# Patient Record
Sex: Male | Born: 1975
Health system: Southern US, Community
[De-identification: ages and names within clinical notes are randomized; demographics above are authoritative.]

## PROBLEM LIST (undated history)

## (undated) DIAGNOSIS — R7989 Other specified abnormal findings of blood chemistry: Secondary | ICD-10-CM

## (undated) DIAGNOSIS — I712 Thoracic aortic aneurysm, without rupture, unspecified: Secondary | ICD-10-CM

## (undated) DIAGNOSIS — F419 Anxiety disorder, unspecified: Secondary | ICD-10-CM

## (undated) DIAGNOSIS — E669 Obesity, unspecified: Secondary | ICD-10-CM

## (undated) DIAGNOSIS — J309 Allergic rhinitis, unspecified: Secondary | ICD-10-CM

## (undated) DIAGNOSIS — L509 Urticaria, unspecified: Secondary | ICD-10-CM

## (undated) DIAGNOSIS — I7121 Aneurysm of the ascending aorta, without rupture: Secondary | ICD-10-CM

## (undated) DIAGNOSIS — E785 Hyperlipidemia, unspecified: Secondary | ICD-10-CM

## (undated) HISTORY — DX: Thoracic aortic aneurysm, without rupture: I71.2

## (undated) HISTORY — PX: ROTATOR CUFF REPAIR: SHX139

## (undated) HISTORY — DX: Hyperlipidemia, unspecified: E78.5

## (undated) HISTORY — DX: Allergic rhinitis, unspecified: J30.9

## (undated) HISTORY — DX: Urticaria, unspecified: L50.9

## (undated) HISTORY — DX: Thoracic aortic aneurysm, without rupture, unspecified: I71.20

## (undated) HISTORY — DX: Anxiety disorder, unspecified: F41.9

## (undated) HISTORY — PX: WRIST SURGERY: SHX841

## (undated) HISTORY — DX: Obesity, unspecified: E66.9

---

## 1998-12-07 ENCOUNTER — Encounter: Admission: RE | Admit: 1998-12-07 | Discharge: 1998-12-07 | Payer: Self-pay | Admitting: *Deleted

## 2000-07-27 ENCOUNTER — Encounter: Payer: Self-pay | Admitting: Emergency Medicine

## 2000-07-27 ENCOUNTER — Emergency Department (HOSPITAL_COMMUNITY): Admission: EM | Admit: 2000-07-27 | Discharge: 2000-07-27 | Payer: Self-pay | Admitting: Emergency Medicine

## 2001-02-17 ENCOUNTER — Encounter: Payer: Self-pay | Admitting: Occupational Medicine

## 2001-02-17 ENCOUNTER — Encounter: Admission: RE | Admit: 2001-02-17 | Discharge: 2001-02-17 | Payer: Self-pay | Admitting: Occupational Medicine

## 2004-06-09 ENCOUNTER — Emergency Department (HOSPITAL_COMMUNITY): Admission: EM | Admit: 2004-06-09 | Discharge: 2004-06-09 | Payer: Self-pay | Admitting: Emergency Medicine

## 2006-07-08 ENCOUNTER — Emergency Department (HOSPITAL_COMMUNITY): Admission: EM | Admit: 2006-07-08 | Discharge: 2006-07-09 | Payer: Self-pay | Admitting: Emergency Medicine

## 2006-10-14 ENCOUNTER — Ambulatory Visit: Payer: Self-pay | Admitting: Internal Medicine

## 2018-01-22 DIAGNOSIS — M25511 Pain in right shoulder: Secondary | ICD-10-CM | POA: Diagnosis not present

## 2018-01-22 DIAGNOSIS — M7541 Impingement syndrome of right shoulder: Secondary | ICD-10-CM | POA: Diagnosis not present

## 2018-02-23 DIAGNOSIS — M7541 Impingement syndrome of right shoulder: Secondary | ICD-10-CM | POA: Diagnosis not present

## 2018-03-03 DIAGNOSIS — M25511 Pain in right shoulder: Secondary | ICD-10-CM | POA: Diagnosis not present

## 2018-03-11 DIAGNOSIS — M7541 Impingement syndrome of right shoulder: Secondary | ICD-10-CM | POA: Diagnosis not present

## 2018-03-23 ENCOUNTER — Other Ambulatory Visit: Payer: Self-pay | Admitting: Internal Medicine

## 2018-03-23 DIAGNOSIS — I712 Thoracic aortic aneurysm, without rupture, unspecified: Secondary | ICD-10-CM

## 2018-03-24 DIAGNOSIS — I77810 Thoracic aortic ectasia: Secondary | ICD-10-CM | POA: Diagnosis not present

## 2018-03-25 ENCOUNTER — Other Ambulatory Visit: Payer: Self-pay

## 2018-03-25 ENCOUNTER — Encounter (HOSPITAL_COMMUNITY): Payer: Self-pay | Admitting: *Deleted

## 2018-03-25 NOTE — Progress Notes (Signed)
Spoke with pt for pre-op call. Pt states he has an Ascending Aortic Aneurysm which was an incidental finding 2 years ago. He states he is followed by Dr. Corrinne Eagle, was told to have aneurysm checked every 2 years. He states Dr. Corrinne Eagle is on medical leave, but had CTA yesterday (in Care Everywhere) and states there was no change. Pt denies any other cardiac history, HTN or Diabetes.

## 2018-03-25 NOTE — Progress Notes (Signed)
Anesthesia Chart Review:  Pt is a same day work up   Case:  [id number] Date/Time:  03/26/18 1430   Procedure:  RIGHT SHOULDER ARTHROSCOPY WITH SUBACROMIAL DECOMPRESSION AND DISTAL CLAVICLE EXCISION (Right Shoulder)   Anesthesia type:  General   Pre-op diagnosis:  Right shoulder chronic impingement, acromioclavicular osteoarthritis   Location:  [facility]OM 06 / [facility]   Surgeon:  Francena Hanly, MD      DISCUSSION:  - Pt is a 42 year old male with hx TAA (4.2 cm, stable by 03/24/18 CT).  - Former smoker. Current smokeless tobacco user   PROVIDERS: Patient, No Pcp Per Cardiologist is Therisa Doyne, MD ("Dr. Sigurd Sos") (notes in care everywhere)   LABS: Will be obtained day of surgery   IMAGES:  CT angio 03/24/18 (care everywhere):  - Ectatic ascending thoracic aorta measuring 4.1 x 3.8 cm in the corrected plane of the vessel.  - In the direct axial plane the maximal measurement of the ascending aorta is 4.2 cm suggesting there has been no significant change from the previously reported measurement of "4.3 cm" on 02/28/2016.   EKG: N/A   CV:  Nuclear stress test 02/29/16 (care everywhere):  1. No reversible ischemia or infarction. 2. Normal left ventricular wall motion. 3. Left ventricular ejection fraction 60% 4. Low-risk stress test findings  Echo 02/29/16:  - Normal LV size and systolic function with no appreciable segmental abnormality. Ejection fraction is visually estimated at 55-60% - Normal Diastolic function - Trace mitral regurgitation - Mild Left atrial enlargement.   Past Medical History:  Diagnosis Date  . Ascending aortic aneurysm (HCC)    Is followed by Dr. Corrinne Eagle at [facility]    Past Surgical History:  Procedure Laterality Date  . WRIST SURGERY Right    to remove ganglion cyst and scar tissue    MEDICATIONS: No current facility-administered medications for this encounter.    Marland Kitchen ALPRAZolam (XANAX) 0.5 MG tablet  . ibuprofen (ADVIL,MOTRIN) 200  MG tablet  . predniSONE (DELTASONE) 10 MG tablet    If labs acceptable day of surgery, I anticipate pt can proceed with surgery as scheduled.  Rica Mast, FNP-BC [facility]/Anesthesiology Phone: [phone] 03/25/2018 1:01 PM

## 2018-03-26 ENCOUNTER — Ambulatory Visit (HOSPITAL_COMMUNITY)
Admission: RE | Admit: 2018-03-26 | Discharge: 2018-03-26 | Disposition: A | Discharge status: Home / Self Care | Payer: Commercial Managed Care - PPO | Source: Ambulatory Visit | Attending: Orthopedic Surgery | Admitting: Orthopedic Surgery

## 2018-03-26 ENCOUNTER — Ambulatory Visit (HOSPITAL_COMMUNITY): Payer: Commercial Managed Care - PPO | Admitting: Emergency Medicine

## 2018-03-26 ENCOUNTER — Encounter (HOSPITAL_COMMUNITY): Payer: Self-pay | Admitting: *Deleted

## 2018-03-26 ENCOUNTER — Encounter (HOSPITAL_COMMUNITY)
Admission: RE | Disposition: A | Discharge status: Home / Self Care | Payer: Self-pay | Source: Ambulatory Visit | Attending: Orthopedic Surgery

## 2018-03-26 DIAGNOSIS — F17290 Nicotine dependence, other tobacco product, uncomplicated: Secondary | ICD-10-CM | POA: Diagnosis not present

## 2018-03-26 DIAGNOSIS — Z79899 Other long term (current) drug therapy: Secondary | ICD-10-CM | POA: Insufficient documentation

## 2018-03-26 DIAGNOSIS — I712 Thoracic aortic aneurysm, without rupture: Secondary | ICD-10-CM | POA: Diagnosis not present

## 2018-03-26 DIAGNOSIS — Z7952 Long term (current) use of systemic steroids: Secondary | ICD-10-CM | POA: Diagnosis not present

## 2018-03-26 DIAGNOSIS — S46191A Other injury of muscle, fascia and tendon of long head of biceps, right arm, initial encounter: Secondary | ICD-10-CM | POA: Diagnosis not present

## 2018-03-26 DIAGNOSIS — M24111 Other articular cartilage disorders, right shoulder: Secondary | ICD-10-CM | POA: Insufficient documentation

## 2018-03-26 DIAGNOSIS — M7541 Impingement syndrome of right shoulder: Secondary | ICD-10-CM | POA: Insufficient documentation

## 2018-03-26 DIAGNOSIS — Z791 Long term (current) use of non-steroidal anti-inflammatories (NSAID): Secondary | ICD-10-CM | POA: Diagnosis not present

## 2018-03-26 DIAGNOSIS — M75111 Incomplete rotator cuff tear or rupture of right shoulder, not specified as traumatic: Secondary | ICD-10-CM | POA: Insufficient documentation

## 2018-03-26 DIAGNOSIS — M19011 Primary osteoarthritis, right shoulder: Secondary | ICD-10-CM | POA: Diagnosis not present

## 2018-03-26 DIAGNOSIS — S43431A Superior glenoid labrum lesion of right shoulder, initial encounter: Secondary | ICD-10-CM | POA: Diagnosis not present

## 2018-03-26 DIAGNOSIS — F419 Anxiety disorder, unspecified: Secondary | ICD-10-CM | POA: Diagnosis not present

## 2018-03-26 DIAGNOSIS — I739 Peripheral vascular disease, unspecified: Secondary | ICD-10-CM | POA: Insufficient documentation

## 2018-03-26 DIAGNOSIS — G8918 Other acute postprocedural pain: Secondary | ICD-10-CM | POA: Diagnosis not present

## 2018-03-26 DIAGNOSIS — M75101 Unspecified rotator cuff tear or rupture of right shoulder, not specified as traumatic: Secondary | ICD-10-CM | POA: Diagnosis not present

## 2018-03-26 HISTORY — DX: Aneurysm of the ascending aorta, without rupture: I71.21

## 2018-03-26 HISTORY — DX: Thoracic aortic aneurysm, without rupture: I71.2

## 2018-03-26 LAB — CBC
HEMATOCRIT: 44.5 % (ref 39.0–52.0)
Hemoglobin: 15.4 g/dL (ref 13.0–17.0)
MCH: 31.4 pg (ref 26.0–34.0)
MCHC: 34.6 g/dL (ref 30.0–36.0)
MCV: 90.8 fL (ref 78.0–100.0)
PLATELETS: 226 10*3/uL (ref 150–400)
RBC: 4.9 MIL/uL (ref 4.22–5.81)
RDW: 12.8 % (ref 11.5–15.5)
WBC: 5.2 10*3/uL (ref 4.0–10.5)

## 2018-03-26 SURGERY — SHOULDER ARTHROSCOPY WITH SUBACROMIAL DECOMPRESSION AND DISTAL CLAVICLE EXCISION
Anesthesia: General | Site: Shoulder | Laterality: Right

## 2018-03-26 MED ORDER — KETOROLAC TROMETHAMINE 30 MG/ML IJ SOLN: 30.0000 mg | Freq: Once | INTRAMUSCULAR | Status: DC | PRN

## 2018-03-26 MED ORDER — PROPOFOL 10 MG/ML IV BOLUS
INTRAVENOUS | Status: AC
  Filled 2018-03-26: qty 20

## 2018-03-26 MED ORDER — FENTANYL CITRATE (PF) 100 MCG/2ML IJ SOLN
100.0000 ug | Freq: Once | INTRAMUSCULAR | Status: AC
  Administered 2018-03-26: 100 ug via INTRAVENOUS

## 2018-03-26 MED ORDER — OXYCODONE HCL 5 MG PO TABS: 5.0000 mg | ORAL_TABLET | Freq: Once | ORAL | Status: DC | PRN

## 2018-03-26 MED ORDER — SUGAMMADEX SODIUM 200 MG/2ML IV SOLN
INTRAVENOUS | Status: DC | PRN
  Administered 2018-03-26: 200 mg via INTRAVENOUS

## 2018-03-26 MED ORDER — BUPIVACAINE-EPINEPHRINE (PF) 0.5% -1:200000 IJ SOLN
INTRAMUSCULAR | Status: DC | PRN
  Administered 2018-03-26 (×6): 3 mL via PERINEURAL

## 2018-03-26 MED ORDER — MIDAZOLAM HCL 2 MG/2ML IJ SOLN
INTRAMUSCULAR | Status: AC
  Administered 2018-03-26: 2 mg via INTRAVENOUS
  Filled 2018-03-26: qty 2

## 2018-03-26 MED ORDER — OXYCODONE-ACETAMINOPHEN 5-325 MG PO TABS: 1.0000 | ORAL_TABLET | ORAL | 0 refills | Status: DC | PRN

## 2018-03-26 MED ORDER — BUPIVACAINE LIPOSOME 1.3 % IJ SUSP
INTRAMUSCULAR | Status: DC | PRN
  Administered 2018-03-26 (×5): 2 mL via PERINEURAL

## 2018-03-26 MED ORDER — OXYCODONE HCL 5 MG/5ML PO SOLN: 5.0000 mg | Freq: Once | ORAL | Status: DC | PRN

## 2018-03-26 MED ORDER — ONDANSETRON HCL 4 MG/2ML IJ SOLN: 4.0000 mg | Freq: Once | INTRAMUSCULAR | Status: DC | PRN

## 2018-03-26 MED ORDER — MIDAZOLAM HCL 5 MG/5ML IJ SOLN
INTRAMUSCULAR | Status: DC | PRN
  Administered 2018-03-26: 2 mg via INTRAVENOUS

## 2018-03-26 MED ORDER — ONDANSETRON HCL 4 MG/2ML IJ SOLN
INTRAMUSCULAR | Status: DC | PRN
  Administered 2018-03-26: 4 mg via INTRAVENOUS

## 2018-03-26 MED ORDER — MIDAZOLAM HCL 2 MG/2ML IJ SOLN
INTRAMUSCULAR | Status: AC
  Filled 2018-03-26: qty 2

## 2018-03-26 MED ORDER — CYCLOBENZAPRINE HCL 10 MG PO TABS: 10.0000 mg | ORAL_TABLET | Freq: Three times a day (TID) | ORAL | 0 refills | Status: AC | PRN

## 2018-03-26 MED ORDER — FENTANYL CITRATE (PF) 250 MCG/5ML IJ SOLN
INTRAMUSCULAR | Status: AC
  Filled 2018-03-26: qty 5

## 2018-03-26 MED ORDER — LIDOCAINE 2% (20 MG/ML) 5 ML SYRINGE
INTRAMUSCULAR | Status: DC | PRN
  Administered 2018-03-26: 100 mg via INTRAVENOUS

## 2018-03-26 MED ORDER — CEFAZOLIN SODIUM-DEXTROSE 2-4 GM/100ML-% IV SOLN
INTRAVENOUS | Status: AC
  Filled 2018-03-26: qty 100

## 2018-03-26 MED ORDER — LACTATED RINGERS IV SOLN
INTRAVENOUS | Status: DC | PRN
  Administered 2018-03-26: 15:00:00 via INTRAVENOUS

## 2018-03-26 MED ORDER — ACETAMINOPHEN 160 MG/5ML PO SOLN: 325.0000 mg | ORAL | Status: DC | PRN

## 2018-03-26 MED ORDER — FENTANYL CITRATE (PF) 100 MCG/2ML IJ SOLN
INTRAMUSCULAR | Status: DC | PRN
  Administered 2018-03-26 (×2): 100 ug via INTRAVENOUS
  Administered 2018-03-26: 50 ug via INTRAVENOUS

## 2018-03-26 MED ORDER — NAPROXEN 500 MG PO TABS: 500.0000 mg | ORAL_TABLET | Freq: Two times a day (BID) | ORAL | 1 refills | Status: DC

## 2018-03-26 MED ORDER — LIDOCAINE 2% (20 MG/ML) 5 ML SYRINGE
INTRAMUSCULAR | Status: AC
  Filled 2018-03-26: qty 5

## 2018-03-26 MED ORDER — ROCURONIUM BROMIDE 50 MG/5ML IV SOLN
INTRAVENOUS | Status: AC
  Filled 2018-03-26: qty 1

## 2018-03-26 MED ORDER — CEFAZOLIN SODIUM-DEXTROSE 2-4 GM/100ML-% IV SOLN: 2.0000 g | Freq: Once | INTRAVENOUS | Status: DC

## 2018-03-26 MED ORDER — FENTANYL CITRATE (PF) 100 MCG/2ML IJ SOLN
INTRAMUSCULAR | Status: AC
  Administered 2018-03-26: 100 ug via INTRAVENOUS
  Filled 2018-03-26: qty 2

## 2018-03-26 MED ORDER — DEXTROSE 5 % IV SOLN
3.0000 g | Freq: Once | INTRAVENOUS | Status: AC
  Administered 2018-03-26: 3 g via INTRAVENOUS
  Filled 2018-03-26: qty 3

## 2018-03-26 MED ORDER — ROCURONIUM BROMIDE 10 MG/ML (PF) SYRINGE
PREFILLED_SYRINGE | INTRAVENOUS | Status: DC | PRN
  Administered 2018-03-26: 50 mg via INTRAVENOUS

## 2018-03-26 MED ORDER — MEPERIDINE HCL 50 MG/ML IJ SOLN: 6.2500 mg | INTRAMUSCULAR | Status: DC | PRN

## 2018-03-26 MED ORDER — FENTANYL CITRATE (PF) 100 MCG/2ML IJ SOLN: 25.0000 ug | INTRAMUSCULAR | Status: DC | PRN

## 2018-03-26 MED ORDER — SODIUM CHLORIDE 0.9 % IR SOLN
Status: DC | PRN
  Administered 2018-03-26: 3000 mL

## 2018-03-26 MED ORDER — LACTATED RINGERS IV SOLN
INTRAVENOUS | Status: DC
  Administered 2018-03-26: 13:00:00 via INTRAVENOUS

## 2018-03-26 MED ORDER — MIDAZOLAM HCL 2 MG/2ML IJ SOLN
2.0000 mg | Freq: Once | INTRAMUSCULAR | Status: AC
  Administered 2018-03-26: 2 mg via INTRAVENOUS

## 2018-03-26 MED ORDER — ACETAMINOPHEN 325 MG PO TABS: 325.0000 mg | ORAL_TABLET | ORAL | Status: DC | PRN

## 2018-03-26 MED ORDER — PROPOFOL 10 MG/ML IV BOLUS
INTRAVENOUS | Status: DC | PRN
  Administered 2018-03-26: 200 mg via INTRAVENOUS

## 2018-03-26 MED ORDER — ONDANSETRON HCL 4 MG PO TABS: 4.0000 mg | ORAL_TABLET | Freq: Three times a day (TID) | ORAL | 0 refills | Status: DC | PRN

## 2018-03-26 SURGICAL SUPPLY — 57 items
BLADE CUTTER GATOR 3.5 (BLADE) ×1 IMPLANT
BLADE EXCALIBUR 4.0X13 (MISCELLANEOUS) ×2 IMPLANT
BLADE GREAT WHITE 4.2 (BLADE) ×1 IMPLANT
BLADE SURG 11 STRL SS (BLADE) ×1 IMPLANT
BOOTCOVER CLEANROOM LRG (PROTECTIVE WEAR) ×2 IMPLANT
BUR OVAL 4.0 (BURR) ×1 IMPLANT
BURR OVAL 8 FLU 4.0X13 (MISCELLANEOUS) ×1 IMPLANT
CANISTER SUCT LVC 12 LTR MEDI- (MISCELLANEOUS) ×2 IMPLANT
CANNULA ACUFLEX KIT 5X76 (CANNULA) ×2 IMPLANT
CANNULA DRILOCK 5.0X75 (CANNULA) IMPLANT
CONNECTOR 5 IN 1 STRAIGHT STRL (MISCELLANEOUS) ×2 IMPLANT
DRAPE INCISE 23X17 IOBAN STRL (DRAPES) ×1
DRAPE INCISE 23X17 STRL (DRAPES) IMPLANT
DRAPE INCISE IOBAN 23X17 STRL (DRAPES) ×1 IMPLANT
DRAPE INCISE IOBAN 66X45 STRL (DRAPES) ×2 IMPLANT
DRAPE ORTHO SPLIT 77X108 STRL (DRAPES) ×4
DRAPE STERI 35X30 U-POUCH (DRAPES) ×1 IMPLANT
DRAPE SURG 17X11 SM STRL (DRAPES) ×2 IMPLANT
DRAPE SURG ORHT 6 SPLT 77X108 (DRAPES) ×2 IMPLANT
DRAPE U-SHAPE 47X51 STRL (DRAPES) IMPLANT
DRSG PAD ABDOMINAL 8X10 ST (GAUZE/BANDAGES/DRESSINGS) ×4 IMPLANT
DURAPREP 26ML APPLICATOR (WOUND CARE) ×2 IMPLANT
GAUZE SPONGE 4X4 12PLY STRL (GAUZE/BANDAGES/DRESSINGS) ×2 IMPLANT
GLOVE BIO SURGEON STRL SZ7.5 (GLOVE) ×2 IMPLANT
GLOVE BIO SURGEON STRL SZ8 (GLOVE) ×2 IMPLANT
GLOVE EUDERMIC 7 POWDERFREE (GLOVE) ×2 IMPLANT
GLOVE SS BIOGEL STRL SZ 7.5 (GLOVE) ×1 IMPLANT
GLOVE SUPERSENSE BIOGEL SZ 7.5 (GLOVE) ×1
GOWN STRL REUS W/ TWL LRG LVL3 (GOWN DISPOSABLE) ×1 IMPLANT
GOWN STRL REUS W/ TWL XL LVL3 (GOWN DISPOSABLE) ×2 IMPLANT
GOWN STRL REUS W/TWL LRG LVL3 (GOWN DISPOSABLE) ×2
GOWN STRL REUS W/TWL XL LVL3 (GOWN DISPOSABLE) ×4
KIT BASIN OR (CUSTOM PROCEDURE TRAY) ×2 IMPLANT
KIT SHOULDER TRACTION (DRAPES) ×2 IMPLANT
KIT TURNOVER KIT B (KITS) ×2 IMPLANT
MANIFOLD NEPTUNE II (INSTRUMENTS) ×2 IMPLANT
NDL SPNL 18GX3.5 QUINCKE PK (NEEDLE) ×1 IMPLANT
NEEDLE SPNL 18GX3.5 QUINCKE PK (NEEDLE) ×2 IMPLANT
NS IRRIG 1000ML POUR BTL (IV SOLUTION) ×1 IMPLANT
PACK SHOULDER (CUSTOM PROCEDURE TRAY) ×2 IMPLANT
PAD ABD 8X10 STRL (GAUZE/BANDAGES/DRESSINGS) ×1 IMPLANT
PAD ARMBOARD 7.5X6 YLW CONV (MISCELLANEOUS) ×4 IMPLANT
SET ARTHROSCOPY TUBING (MISCELLANEOUS)
SET ARTHROSCOPY TUBING LN (MISCELLANEOUS) ×1 IMPLANT
SLING ARM FOAM STRAP LRG (SOFTGOODS) IMPLANT
SLING ARM FOAM STRAP MED (SOFTGOODS) ×2 IMPLANT
SPONGE LAP 4X18 X RAY DECT (DISPOSABLE) IMPLANT
STRIP CLOSURE SKIN 1/2X4 (GAUZE/BANDAGES/DRESSINGS) ×2 IMPLANT
SUT MNCRL AB 3-0 PS2 18 (SUTURE) ×2 IMPLANT
SUT PDS AB 0 CT 36 (SUTURE) IMPLANT
SYR 20CC LL (SYRINGE) IMPLANT
TAPE PAPER 3X10 WHT MICROPORE (GAUZE/BANDAGES/DRESSINGS) ×2 IMPLANT
TOWEL OR 17X24 6PK STRL BLUE (TOWEL DISPOSABLE) ×1 IMPLANT
TOWEL OR 17X26 10 PK STRL BLUE (TOWEL DISPOSABLE) ×2 IMPLANT
TUBING ARTHROSCOPY IRRIG 16FT (MISCELLANEOUS) ×1 IMPLANT
WAND SUCTION MAX 4MM 90S (SURGICAL WAND) ×1 IMPLANT
WATER STERILE IRR 1000ML POUR (IV SOLUTION) ×2 IMPLANT

## 2018-03-26 NOTE — Anesthesia Preprocedure Evaluation (Signed)
Anesthesia Evaluation  Patient identified by MRN, date of birth, ID band Patient awake    Reviewed: Allergy & Precautions, NPO status , Patient's Chart, lab work & pertinent test results  Airway Mallampati: II       Dental no notable dental hx. (+) Teeth Intact   Pulmonary former smoker,    Pulmonary exam normal breath sounds clear to auscultation       Cardiovascular + Peripheral Vascular Disease  Normal cardiovascular exam Rhythm:Regular Rate:Normal     Neuro/Psych PSYCHIATRIC DISORDERS Anxiety    GI/Hepatic negative GI ROS, Neg liver ROS,   Endo/Other  negative endocrine ROS  Renal/GU negative Renal ROS  negative genitourinary   Musculoskeletal negative musculoskeletal ROS (+)   Abdominal (+) + obese,   Peds  Hematology negative hematology ROS (+)   Anesthesia Other Findings  Nurse Practitioner Anesthesiology Progress Notes Signed Date of Service:  03/25/2018 12:51 PM    Signed     Anesthesia Chart Review:  Pt is a same day work up    Case:  [id number] Date/Time:  03/26/18 1430  Procedure:  RIGHT SHOULDER ARTHROSCOPY WITH SUBACROMIAL DECOMPRESSION AND DISTAL CLAVICLE EXCISION (Right Shoulder)  Anesthesia type:  General  Pre-op diagnosis:  Right shoulder chronic impingement, acromioclavicular osteoarthritis  Location:  [facility]  Surgeon:  Francena Hanly, MD    DISCUSSION:  - Pt is a 42 year old male with hx TAA (4.2 cm, stable by 03/24/18 CT).  - Former smoker. Current smokeless tobacco user   PROVIDERS: Patient, No Pcp Per Cardiologist is Therisa Doyne, MD ("Dr. Sigurd Sos") (notes in care everywhere)   LABS: Will be obtained day of surgery   IMAGES:  CT angio 03/24/18 (care everywhere):  - Ectatic ascending thoracic aorta measuring 4.1 x 3.8 cm in the corrected plane of the vessel.  - In the direct axial plane the maximal measurement of the ascending aorta is  4.2 cm suggesting there has been no significant change from the previously reported measurement of "4.3 cm" on 02/28/2016.   EKG: N/A   CV:  Nuclear stress test 02/29/16 (care everywhere):  1. No reversible ischemia or infarction. 2. Normal left ventricular wall motion. 3. Left ventricular ejection fraction 60% 4. Low-risk stress test findings  Echo 02/29/16:  - Normal LV size and systolic function with no appreciable segmental abnormality. Ejection fraction is visually estimated at 55-60% - Normal Diastolic function - Trace mitral regurgitation - Mild Left atrial enlargement.    Past Medical History: Diagnosis Date . Ascending aortic aneurysm (HCC)   Is followed by Dr. Corrinne Eagle at [facility]    Past Surgical History: Procedure Laterality Date . WRIST SURGERY Right   to remove ganglion cyst and scar tissue   MEDICATIONS:  No current facility-administered medications for this encounter.    Marland Kitchen ALPRAZolam (XANAX) 0.5 MG tablet . ibuprofen (ADVIL,MOTRIN) 200 MG tablet . predniSONE (DELTASONE) 10 MG tablet   If labs acceptable day of surgery, I anticipate pt can proceed with surgery as scheduled.  Rica Mast, FNP-BC [facility] Stay Surgical Center/Anesthesiology Phone: [phone] 03/25/2018 1:01 PM                Winifred Olive, RN            Reproductive/Obstetrics                             Anesthesia Physical Anesthesia Plan  ASA: II  Anesthesia Plan: General   Post-op Pain Management:  Regional for Post-op pain   Induction:   PONV Risk Score and Plan: 2 and Ondansetron, Dexamethasone and Midazolam  Airway Management Planned: Oral ETT  Additional Equipment:   Intra-op Plan:   Post-operative Plan: Extubation in OR  Informed Consent: I have reviewed the patients History and Physical, chart, labs and discussed the procedure including the risks, benefits and alternatives  for the proposed anesthesia with the patient or authorized representative who has indicated his/her understanding and acceptance.   Dental advisory given  Plan Discussed with: CRNA and Surgeon  Anesthesia Plan Comments:         Anesthesia Quick Evaluation

## 2018-03-26 NOTE — Anesthesia Postprocedure Evaluation (Signed)
Anesthesia Post Note  Patient: Phillip Tran  Procedure(s) Performed: RIGHT SHOULDER ARTHROSCOPY WITH SUBACROMIAL DECOMPRESSION AND DISTAL CLAVICLE EXCISION (Right Shoulder)     Patient location during evaluation: PACU Anesthesia Type: General Level of consciousness: awake and alert Pain management: pain level controlled Vital Signs Assessment: post-procedure vital signs reviewed and stable Respiratory status: spontaneous breathing, nonlabored ventilation, respiratory function stable and patient connected to nasal cannula oxygen Cardiovascular status: blood pressure returned to baseline and stable Postop Assessment: no apparent nausea or vomiting Anesthetic complications: no    Last Vitals:  Vitals:   03/26/18 1640 03/26/18 1653  BP: (!) 131/93 130/89  Pulse: 79 76  Resp: 16 15  Temp:    SpO2: 96% 97%    Last Pain:  Vitals:   03/26/18 1625  TempSrc:   PainSc: 0-No pain                 Juliani Laduke

## 2018-03-26 NOTE — H&P (Signed)
Phillip Tran    Chief Complaint: Right shoulder chronic impingement, acromioclavicular osteoarthritis HPI: The patient is a 42 y.o. male with chronic right shoulder pain refractory to conservative management.  Past Medical History:  Diagnosis Date  . Ascending aortic aneurysm (HCC)    Is followed by Dr. Sharol Roussel    Past Surgical History:  Procedure Laterality Date  . WRIST SURGERY Right    to remove ganglion cyst and scar tissue    Family History  Problem Relation Age of Onset  . Scoliosis Father   . Arthritis/Rheumatoid Father     Social History:  reports that he has quit smoking. His smokeless tobacco use includes snuff. He reports that he drinks about 12.6 oz of alcohol per week. He reports that he does not use drugs.   Medications Prior to Admission  Medication Sig Dispense Refill  . ALPRAZolam (XANAX) 0.5 MG tablet Take 0.5 mg by mouth daily as needed for anxiety.  5  . ibuprofen (ADVIL,MOTRIN) 200 MG tablet Take 800 mg by mouth daily.    . predniSONE (DELTASONE) 10 MG tablet Take 10 mg by mouth daily as needed (for hives).   1     Physical Exam: right shoulder with painful and restricted motion as noted at recent office visits  Vitals  Temp:  [98.9 F (37.2 C)] 98.9 F (37.2 C) (05/09 1228) Pulse Rate:  [84] 84 (05/09 1228) Resp:  [20] 20 (05/09 1228) BP: (153)/(92) 153/92 (05/09 1228) SpO2:  [98 %] 98 % (05/09 1228) Weight:  [123.8 kg (273 lb)] 123.8 kg (273 lb) (05/09 1300)  Assessment/Plan  Impression: Right shoulder chronic impingement, acromioclavicular osteoarthritis  Plan of Action: Procedure(s): RIGHT SHOULDER ARTHROSCOPY WITH SUBACROMIAL DECOMPRESSION AND DISTAL CLAVICLE EXCISION  [name] [name] 03/26/2018, 2:26 PM Contact # [phone]

## 2018-03-26 NOTE — Anesthesia Procedure Notes (Signed)
Anesthesia Regional Block: Interscalene brachial plexus block   Pre-Anesthetic Checklist: ,, timeout performed, Correct Patient, Correct Site, Correct Laterality, Correct Procedure, Correct Position, site marked, Risks and benefits discussed,  Surgical consent,  Pre-op evaluation,  At surgeon's request and post-op pain management  Laterality: Right and Upper  Prep: chloraprep       Needles:  Injection technique: Single-shot  Needle Type: Echogenic Stimulator Needle     Needle Length: 10cm  Needle Gauge: 21   Needle insertion depth: 2 cm   Additional Needles:   Procedures:,,,, ultrasound used (permanent image in chart),,,,  Narrative:  Start time: 03/26/2018 2:15 PM End time: 03/26/2018 2:26 PM Injection made incrementally with aspirations every 5 mL.  Performed by: Personally  Anesthesiologist: Leilani Able, MD

## 2018-03-26 NOTE — Transfer of Care (Signed)
Immediate Anesthesia Transfer of Care Note  Patient: Phillip Tran  Procedure(s) Performed: RIGHT SHOULDER ARTHROSCOPY WITH SUBACROMIAL DECOMPRESSION AND DISTAL CLAVICLE EXCISION (Right Shoulder)  Patient Location: PACU  Anesthesia Type:General  Level of Consciousness: awake  Airway & Oxygen Therapy: Patient Spontanous Breathing and Patient connected to nasal cannula oxygen  Post-op Assessment: Report given to RN and Post -op Vital signs reviewed and stable  Post vital signs: Reviewed and stable  Last Vitals:  Vitals Value Taken Time  BP 146/127 03/26/2018  4:25 PM  Temp    Pulse 78 03/26/2018  4:28 PM  Resp 18 03/26/2018  4:25 PM  SpO2 99 % 03/26/2018  4:28 PM  Vitals shown include unvalidated device data.  Last Pain:  Vitals:   03/26/18 1435  TempSrc:   PainSc: 0-No pain      Patients Stated Pain Goal: 7 (03/26/18 1300)  Complications: No apparent anesthesia complications

## 2018-03-26 NOTE — Op Note (Signed)
03/26/2018  4:02 PM  PATIENT:   Phillip Tran  42 y.o. male  PRE-OPERATIVE DIAGNOSIS:  Right shoulder chronic impingement, acromioclavicular osteoarthritis  POST-OPERATIVE DIAGNOSIS:  Same with labral tear and partial articular rotator cuff tear  PROCEDURE:  RSA, debridement, SAD, DCR  SURGEON:  Allix Blomquist, Vania Rea M.D.  ASSISTANTS: Shuford pac   ANESTHESIA:   GET + ISB  EBL: min  SPECIMEN:  none  Drains: none   PATIENT DISPOSITION:  PACU - hemodynamically stable.  Brief history:   Mr. Lewter presents with a long history of right shoulder pain and progressive increasing functional limitations that has been refractory to prolonged attempts at conservative management.  Preoperative MRI scan shows the rotator cuff to be intact.  Due to his ongoing pain and functional limitations he is brought to the operating this time for planned right shoulder arthroscopy as described below  I believe counseled Mr. Navis regarding treatment options, as well as the potential risks versus thorough.  Possible surgical complications were reviewed including the potential for bleeding, infection, neurovascular injury, persistent pain, anesthetic complication, and possible need for additional surgery.  He understands and accepts and agrees to the planned procedure.  Procedure in detail  Patient was identified in preop holding area and received prophylactic antibiotics.  An interscalene block with Exparel was placed by the anesthesia department.  He was brought to the operative room placed supine on the operating room underwent smooth induction of a general endotracheal anesthesia.  Turned to the left lateral decubitus position on the beanbag and appropriately padded and protected.  Right shoulder examination under anesthesia revealed full motion.  No instability patterns were noted.  Right arm was then suspended at 70 degrees of abduction 10 pounds of traction the right shoulder girdle region was sterilely  prepped and draped in standard fashion.  Timeout was called.  Posterior portal was established into the glenohumeral joint and the anterior portal established under direct visualization.  The articular surfaces were all found to be in excellent condition.  Capsular volume was within normal limits.  Degenerative tearing of the superior labrum consistent with a type I SLAP lesion was identified.  This is debrided with shaver.  This did extend partially into the root of bicep is no obvious instability the biceps anchor and the biceps tendon itself showed normal caliber with no evidence for proximal or distal instability.  Rotator cuff showed partial articular sided tearing which was treated with a shaver and appeared to represent no more than perhaps 5 to 10% of the thickness of the tendon.  At this point final inspection and irrigation was then completed within the knee joint.  Fluid was removed.  The Thompson drop 10 to 30 degrees of abduction arthroscope introduced in the subacromial space a posterior portal and a direct lateral portal was established in the subacromial space.  Abundant dense bursal tissue multiple adhesions were encountered and these were all divided and excised with combination the [employer] one.  One was then used to remove the periosteum from the undersurface of the anterior half of the acromion and a subacromial decompression was performed with a pericardial type I morphology.  A portal was then established trachea anterior to the distal clavicle and distal clavicle resection was performed with a bur.  Care was taken to confirm visualization of the entire circumference of the distal clavicle to assure adequate removal of bone.  We then completed the subacromial/subdeltoid bursectomy.  Of note there was very dense thickened bursal tissue  throughout the space which was completely excised.  We then carefully inspected and probed the bursal surface of the rotator cuff and this was found to be  intact with good caliber.  At this point final hemostasis was obtained.  Fluid analysis removed.  Portals were closed with Monocryl and a Steri-Strip.  A bulky dry dressing was taped about the right shoulder and right arm was placed in the sling and patient awakened explained to the recovery room in stable condition.  Tissue.  VAC was used as an Geophysicist/field seismologist throughout this case essential for help with positioning of the patient positioning extremity tissue manipulation/management wound closure and intraoperative decision-making.   PLAN OF CARE: Discharge to home after PACU     Contact # [phone]

## 2018-03-26 NOTE — Discharge Instructions (Signed)
° °Kevin M. Supple, M.D., F.A.A.O.S. °Orthopaedic Surgery °Specializing in Arthroscopic and Reconstructive °Surgery of the Shoulder and Knee °[phone] °[address] - [place], [place] [address] - Fax [phone] ° ° °POST-OP SHOULDER ARTHROSCOPY INSTRUCTIONS ° °1. Call the office at [phone] to schedule your first post-op appointment 7-10 days from the date of your surgery. ° °2. Leave the steri-strips in place over your incisions when performing dressing changes and showering. You may remove your dressings and begin showering 72 hours from surgery. You can expect drainage that is clear to bloody in nature that occasionally will soak through your dressings. If this occurs go ahead and perform a dressing change. The drainage should lessen daily and when there is no drainage from your incisions feel free to go without a dressing. ° °3. Wear your sling for comfort. You may come out of your sling for ad lib activity and even decide not to use the sling at all. If you find you are more comfortable in your sling, make sure you come out of your sling at least 3-4 times a day to do the exercises that are included below. ° °4. Range of motion to your elbow, wrist, and hand are encouraged 3-5 times daily. Exercise to your hand and fingers helps to reduce swelling you may experience. ° °5. Utilize ice to the shoulder 3-4 times minimum a day and additionally if you are experiencing pain. ° °6. You may drive when safely off narcotics and muscle relaxants. ° °7. If you had a block pre-operatively to provide post-op pain relief you may want to go ahead and begin utilizing your pain meds as your arm begins to wake up. Blocks can sometimes last up to 16-18 hours. If you are still pain-free prior to going to bed you may want to strongly consider taking a pain medication to avoid being awakened in the night with the onset of pain. A muscle relaxant is also provided for you should you experience muscle spasms. It  is recommended that if you are experiencing pain that your pain medication alone is not controlling, add the muscle relaxant along with the pain medication which can give additional pain relief. The first one to two days is generally the most severe of your pain and then should gradually decrease. As your pain lessens it is recommended that you decrease your use of the pain medications to an "as needed basis" only and to always comply with the recommended dosages of the pain medications. ° °8. Pain medications can produce constipation along with their use. If you experience this, the use of an over the counter stool softener or laxative daily is recommended.  ° °9. For additional questions or concerns, please do not hesitate to call the office. If after hours there is an answering service to forward your concerns to the physician on call. ° °10.Pain control following an exparel block ° °To help control your post-operative pain you received a nerve block  performed with Exparel which is a long acting anesthetic (numbing agent) which can provide pain relief and sensations of numbness (and relief of pain) in the operative shoulder and arm for up to 3 days. Sometimes it provides mixed relief, meaning you may still have numbness in certain areas of the arm but can still be able to move  parts of that arm, hand, and fingers. We recommend that your prescribed pain medications  be used as needed. We do not feel it is necessary to "pre medicate" and "stay   ahead" of pain.  Taking narcotic pain medications when you are not having any pain can lead to unnecessary and potentially dangerous side effects.  ° ° °POST-OP EXERCISES ° °The pendulum exercises should be performed while bending at the waist as far over as possible thereby letting gravity do the work for you. ° °Range of Motion Exercises: Pendulum (circular) ° °Repeat 20 times. Do 3 sessions per day. ° ° ° ° °Range of Motion Exercises: Pendulum (side-to-side) ° °Repeat 20  times. Do 3 sessions per day. ° ° ° °Range of Motion Exercises (self-stretching activities): ° °Slide arm up wall with palm toward you, moving closer to the wall. Hold for 5 seconds. ° °Repeat 10 times. Do 3 sessions per day. ° ° ° ° ° ° °

## 2018-03-26 NOTE — Anesthesia Procedure Notes (Signed)
Procedure Name: Intubation Date/Time: 03/26/2018 2:57 PM Performed by: Purvis Kilts, CRNA Pre-anesthesia Checklist: Patient identified, Emergency Drugs available, Suction available, Patient being monitored and Timeout performed Patient Re-evaluated:Patient Re-evaluated prior to induction Oxygen Delivery Method: Circle system utilized Preoxygenation: Pre-oxygenation with 100% oxygen Induction Type: IV induction Ventilation: Mask ventilation without difficulty Laryngoscope Size: Mac and 4 Tube type: Oral Tube size: 7.5 mm Number of attempts: 1 Airway Equipment and Method: Stylet Placement Confirmation: ETT inserted through vocal cords under direct vision,  positive ETCO2 and breath sounds checked- equal and bilateral Secured at: 21 cm Tube secured with: Tape Dental Injury: Teeth and Oropharynx as per pre-operative assessment

## 2018-04-10 DIAGNOSIS — I890 Lymphedema, not elsewhere classified: Secondary | ICD-10-CM | POA: Diagnosis not present

## 2018-04-10 DIAGNOSIS — Z4889 Encounter for other specified surgical aftercare: Secondary | ICD-10-CM | POA: Diagnosis not present

## 2018-04-20 DIAGNOSIS — M25611 Stiffness of right shoulder, not elsewhere classified: Secondary | ICD-10-CM | POA: Diagnosis not present

## 2018-04-22 DIAGNOSIS — M25611 Stiffness of right shoulder, not elsewhere classified: Secondary | ICD-10-CM | POA: Diagnosis not present

## 2018-04-28 DIAGNOSIS — M25611 Stiffness of right shoulder, not elsewhere classified: Secondary | ICD-10-CM | POA: Diagnosis not present

## 2018-05-04 DIAGNOSIS — M25611 Stiffness of right shoulder, not elsewhere classified: Secondary | ICD-10-CM | POA: Diagnosis not present

## 2018-05-11 DIAGNOSIS — M25611 Stiffness of right shoulder, not elsewhere classified: Secondary | ICD-10-CM | POA: Diagnosis not present

## 2018-05-13 DIAGNOSIS — M25611 Stiffness of right shoulder, not elsewhere classified: Secondary | ICD-10-CM | POA: Diagnosis not present

## 2018-05-22 DIAGNOSIS — M25611 Stiffness of right shoulder, not elsewhere classified: Secondary | ICD-10-CM | POA: Diagnosis not present

## 2018-05-27 DIAGNOSIS — M25611 Stiffness of right shoulder, not elsewhere classified: Secondary | ICD-10-CM | POA: Diagnosis not present

## 2018-05-29 DIAGNOSIS — M25611 Stiffness of right shoulder, not elsewhere classified: Secondary | ICD-10-CM | POA: Diagnosis not present

## 2018-06-03 DIAGNOSIS — M25611 Stiffness of right shoulder, not elsewhere classified: Secondary | ICD-10-CM | POA: Diagnosis not present

## 2018-06-09 DIAGNOSIS — M25611 Stiffness of right shoulder, not elsewhere classified: Secondary | ICD-10-CM | POA: Diagnosis not present

## 2018-06-12 DIAGNOSIS — M25611 Stiffness of right shoulder, not elsewhere classified: Secondary | ICD-10-CM | POA: Diagnosis not present

## 2018-06-16 DIAGNOSIS — Z0000 Encounter for general adult medical examination without abnormal findings: Secondary | ICD-10-CM | POA: Diagnosis not present

## 2018-06-16 DIAGNOSIS — M25611 Stiffness of right shoulder, not elsewhere classified: Secondary | ICD-10-CM | POA: Diagnosis not present

## 2018-06-16 DIAGNOSIS — E7849 Other hyperlipidemia: Secondary | ICD-10-CM | POA: Diagnosis not present

## 2018-06-22 DIAGNOSIS — Z0000 Encounter for general adult medical examination without abnormal findings: Secondary | ICD-10-CM | POA: Diagnosis not present

## 2018-06-22 DIAGNOSIS — E7849 Other hyperlipidemia: Secondary | ICD-10-CM | POA: Diagnosis not present

## 2018-06-22 DIAGNOSIS — I712 Thoracic aortic aneurysm, without rupture: Secondary | ICD-10-CM | POA: Diagnosis not present

## 2018-06-22 DIAGNOSIS — L509 Urticaria, unspecified: Secondary | ICD-10-CM | POA: Diagnosis not present

## 2018-06-23 DIAGNOSIS — M25611 Stiffness of right shoulder, not elsewhere classified: Secondary | ICD-10-CM | POA: Diagnosis not present

## 2018-11-18 DIAGNOSIS — J411 Mucopurulent chronic bronchitis: Secondary | ICD-10-CM | POA: Diagnosis not present

## 2019-08-03 NOTE — Pre-Procedure Instructions (Signed)
[id number] - [place], [place] - [address] [place]  [id number] Phone: [phone] Fax: [phone]      Your procedure is scheduled on Wednesday September 23rd.  Report to [facility] Main Entrance "A" at 8:00 A.M., and check in at the Admitting office.  Call this number if you have problems the morning of surgery:  [phone]  Call [phone] if you have any questions prior to your surgery date Monday-Friday 8am-4pm    Remember:  Do not eat or drink after midnight the night before your surgery     Take these medicines the morning of surgery with A SIP OF WATER  fluticasone (FLONASE) nasal spray ALPRAZolam (XANAX) cyclobenzaprine (FLEXERIL)   7 days prior to surgery STOP taking any Aspirin (unless otherwise instructed by your surgeon), Aleve, Naproxen, Ibuprofen, Motrin, Advil, Goody's, BC's, all herbal medications, fish oil, and all vitamins.    The Morning of Surgery  Do not wear jewelry, make-up or nail polish.  Do not wear lotions, powders, or perfumes/colognes, or deodorant  Do not shave 48 hours prior to surgery.  Men may shave face and neck.  Do not bring valuables to the hospital.  [facility] is not responsible for any belongings or valuables.  If you are a smoker, DO NOT Smoke 24 hours prior to surgery IF you wear a CPAP at night please bring your mask, tubing, and machine the morning of surgery   Remember that you must have someone to transport you home after your surgery, and remain with you for 24 hours if you are discharged the same day.   Contacts, glasses, hearing aids, dentures or bridgework may not be worn into surgery.    Leave your suitcase in the car.  After surgery it may be brought to your room.  For patients admitted to the hospital, discharge time will be determined by your treatment team.  Patients discharged the day of surgery will not be allowed to drive home.    Special instructions:   [place]-  Preparing For Surgery  Before surgery, you can play an important role. Because skin is not sterile, your skin needs to be as free of germs as possible. You can reduce the number of germs on your skin by washing with CHG (chlorahexidine gluconate) Soap before surgery.  CHG is an antiseptic cleaner which kills germs and bonds with the skin to continue killing germs even after washing.    Oral Hygiene is also important to reduce your risk of infection.  Remember - BRUSH YOUR TEETH THE MORNING OF SURGERY WITH YOUR REGULAR TOOTHPASTE  Please do not use if you have an allergy to CHG or antibacterial soaps. If your skin becomes reddened/irritated stop using the CHG.  Do not shave (including legs and underarms) for at least 48 hours prior to first CHG shower. It is OK to shave your face.  Please follow these instructions carefully.   1. Shower the NIGHT BEFORE SURGERY and the MORNING OF SURGERY with CHG Soap.   2. If you chose to wash your hair, wash your hair first as usual with your normal shampoo.  3. After you shampoo, rinse your hair and body thoroughly to remove the shampoo.  4. Use CHG as you would any other liquid soap. You can apply CHG directly to the skin and wash gently with a scrungie or a clean washcloth.   5. Apply the CHG Soap to your body ONLY FROM THE NECK DOWN.  Do not  use on open wounds or open sores. Avoid contact with your eyes, ears, mouth and genitals (private parts). Wash Face and genitals (private parts)  with your normal soap.   6. Wash thoroughly, paying special attention to the area where your surgery will be performed.  7. Thoroughly rinse your body with warm water from the neck down.  8. DO NOT shower/wash with your normal soap after using and rinsing off the CHG Soap.  9. Pat yourself dry with a CLEAN TOWEL.  10. Wear CLEAN PAJAMAS to bed the night before surgery, wear comfortable clothes the morning of surgery  11. Place CLEAN SHEETS on your bed the night of  your first shower and DO NOT SLEEP WITH PETS.    Day of Surgery:  Do not apply any deodorants/lotions. Please shower the morning of surgery with the CHG soap  Please wear clean clothes to the hospital/surgery center.   Remember to brush your teeth WITH YOUR REGULAR TOOTHPASTE.   Please read over the following fact sheets that you were given.

## 2019-08-04 ENCOUNTER — Encounter (HOSPITAL_COMMUNITY): Payer: Self-pay

## 2019-08-04 ENCOUNTER — Encounter (HOSPITAL_COMMUNITY)
Admission: RE | Admit: 2019-08-04 | Discharge: 2019-08-04 | Disposition: A | Discharge status: Home / Self Care | Payer: Commercial Managed Care - PPO | Source: Ambulatory Visit | Attending: Otolaryngology | Admitting: Otolaryngology

## 2019-08-04 ENCOUNTER — Other Ambulatory Visit: Payer: Self-pay

## 2019-08-04 DIAGNOSIS — Z01812 Encounter for preprocedural laboratory examination: Secondary | ICD-10-CM | POA: Insufficient documentation

## 2019-08-04 DIAGNOSIS — Z01810 Encounter for preprocedural cardiovascular examination: Secondary | ICD-10-CM | POA: Insufficient documentation

## 2019-08-04 HISTORY — DX: Other specified abnormal findings of blood chemistry: R79.89

## 2019-08-04 LAB — COMPREHENSIVE METABOLIC PANEL
ALT: 117 U/L — ABNORMAL HIGH (ref 0–44)
AST: 78 U/L — ABNORMAL HIGH (ref 15–41)
Albumin: 4.1 g/dL (ref 3.5–5.0)
Alkaline Phosphatase: 67 U/L (ref 38–126)
Anion gap: 11 (ref 5–15)
BUN: 9 mg/dL (ref 6–20)
CO2: 22 mmol/L (ref 22–32)
Calcium: 9.2 mg/dL (ref 8.9–10.3)
Chloride: 108 mmol/L (ref 98–111)
Creatinine, Ser: 0.72 mg/dL (ref 0.61–1.24)
GFR calc Af Amer: >60 mL/min (ref >60)
GFR calc non Af Amer: >60 mL/min (ref >60)
Glucose, Bld: 92 mg/dL (ref 70–99)
Potassium: 3.6 mmol/L (ref 3.5–5.1)
Sodium: 141 mmol/L (ref 135–145)
Total Bilirubin: 0.7 mg/dL (ref 0.3–1.2)
Total Protein: 7.1 g/dL (ref 6.5–8.1)

## 2019-08-04 LAB — CBC
HCT: 44.2 % (ref 39.0–52.0)
Hemoglobin: 15.7 g/dL (ref 13.0–17.0)
MCH: 32.5 pg (ref 26.0–34.0)
MCHC: 35.5 g/dL (ref 30.0–36.0)
MCV: 91.5 fL (ref 80.0–100.0)
Platelets: 213 10*3/uL (ref 150–400)
RBC: 4.83 MIL/uL (ref 4.22–5.81)
RDW: 12.6 % (ref 11.5–15.5)
WBC: 5.2 10*3/uL (ref 4.0–10.5)
nRBC: 0 % (ref 0.0–0.2)

## 2019-08-04 NOTE — Progress Notes (Signed)
   08/04/19 0827  OBSTRUCTIVE SLEEP APNEA  Have you ever been diagnosed with sleep apnea through a sleep study? No  Do you snore loudly (loud enough to be heard through closed doors)?  1  Do you often feel tired, fatigued, or sleepy during the daytime (such as falling asleep during driving or talking to someone)? 0  Has anyone observed you stop breathing during your sleep? 1  Do you have, or are you being treated for high blood pressure? 0  BMI more than 35 kg/m2? 1  Age > 50 (1-yes) 0  Neck circumference greater than:Male 16 inches or larger, Male 17inches or larger? 1  Male Gender (Yes=1) 1  Obstructive Sleep Apnea Score 5  Score 5 or greater  Results sent to PCP

## 2019-08-04 NOTE — Progress Notes (Signed)
PCP - Burnard Bunting Cardiologist - Clydie Braun, Catalina Lunger - last visit 03/23/18, Cardiac CT 2019 (CE)  Chest x-ray - not needed EKG - 08/04/19 Stress Test - 02/29/16 ECHO - 02/29/16 Cardiac Cath - denies  Anesthesia review: yes, Thoracic aortic aneurysm, has not seen cardiology since 2019 COVID test scheduled for 08/09/19  Patient denies shortness of breath, fever, cough and chest pain at PAT appointment   Patient verbalized understanding of instructions that were given to them at the PAT appointment. Patient was also instructed that they will need to review over the PAT instructions again at home before surgery.

## 2019-08-05 NOTE — Progress Notes (Addendum)
Anesthesia Chart Review:  Case: [id number] Date/Time: 08/11/19 0945   Procedures:      BILATERAL ENDOSCOPIC SINUS SURGERY (Bilateral )     NASAL SEPTOPLASTY WITH TURBINATE REDUCTION (Bilateral )     SINUS ENDO WITH FUSION (Bilateral )   Anesthesia type: General   Pre-op diagnosis: CHRONIC SINUSITIS, DEVIATED SEPTUM, NASAL TURBINATE HYPERTROPHY   Location: [facility] ROOM 09 / [place] [facility]   Surgeon: Jerrell Belfast, MD      DISCUSSION: Patient is a 43 year old male scheduled for the above procedure.  History includes former smoker (quit smoking 08/03/09, but uses smokeless tobacco), ascending TAA (4.2 cm, stable by 03/24/18 CTA), right shoulder arthroscopy (03/26/18), elevated LFTs (heavy alcohol intake). BMI is consistent with obesity. OSA screening score of 5.   Patient's wife Benjie Karvonen is an OR Therapist, sports at [employer] with General Surgery.   Last seen by cardiology for TAA surveillance in 03/2018 with stable measurements of 4.2 cm. Mr. Ohlin reports that he was advised 2 year follow-up. I attempted to confirm with Dr. Clydie Braun, but he is out on medical leave. Patient was last seen by "Dr. Coralee Pesa" in 2017 when that practice was a part of [facility]--but now Dr. Coralee Pesa is with [facility], and patient has not actually had an office visit yet there. Patient's wife reported that Dr. Reynaldo Minium actually ordered the 2019 CTA chest, so I attempted to get Dr. Jacquiline Doe input regarding follow-up recommendations, but he could not provide additional input given he last saw patient over a year ago (in 06/2018). Patient/wife says they plan to get him established with [facility]CTS within the next several months to have on-going TAA follow-up.  Preoperative labs show elevated LFTs with AST 78, ALT 117. ETOH intake is documented as 21 standard drinks or equivalent/week; however when I spoke with Mr. Austad he said he actually typically has 4-5 drinks of brown liquor + Coke drinks daily. He also takes about 8-200 mg tablets of ibuprofen on most days for shoulder  pain, as he works as a Dealer. He reports that his LFTs have been elevated for about 5-6 years. He is not aware of any specific imaging. He reports Dr. Reynaldo Minium has asked that he cut down on his alcohol intake. No known history of hepatitis or IV drug use. Comparison labs received from Dr. Reynaldo Minium from 7/30/19and are consistent with PAT labs showing an AST of 55 and ALT of 102. Alk Phos 99 and total bilirubin 0.8. LFTs also mildly elevated on 02/28/16 with AST 41, ALT 61. He is to hold NSAIDS for surgery and because he will be off work, he is hopeful that he will not require any pain medications for his shoulder. He was advised to cut down on alcohol use. Will plan to check a STAT HFP on the morning of surgery, but given results are similar a year ago, this seems to be a chronic process.  I initially discussed history with anesthesiologists Hoy Morn, MD and Belinda Block, MD. I attempted to get additional input from patient's providers "Dr. Coralee Pesa" and Dr. Reynaldo Minium regarding TAA, but have been unsuccessful. Patient reports that he was told TAA needed 2 year follow-up. He denied chest pain, back pain, chest tenderness, SOB (other than that related to his chronic sinusitis), hoarseness, or persistent cough. His BP was 132/78. He is not a smoker, but does use smokeless tobacco. Anesthesiologist Roberts Gaudy, MD has been assigned to his case, so I have updated him on patient's history and my discussions as outlined. Patient's TAA stable  at 4.2 cm x 4.0 cm in 03/2018 (previously 4.3 cm X 3.8 cm), 2017 echo showed no AV disease with aortic root of 3.7 cm. If patient remains asymptomatic then it is anticipated that he can proceed as planned.   As above, he will get a STAT HFP on arrival to ensure stability of LFTs and is scheduled for his presurgical COVID-19 test on 08/09/19.    VS: BP 132/78 Comment: taken manually  Pulse 78   Temp 36.8 C   Resp 20   Ht 6' (1.829 m)   Wt 124.4 kg   SpO2 96%   BMI 37.20  kg/m   PROVIDERS: Burnard Bunting, MD is PCP Milana Huntsman, MD is cardiologist (Care Everywhere)   LABS: Preoperative labs reviewed. See DISCUSSION. (all labs ordered are listed, but only abnormal results are displayed)  Labs Reviewed  COMPREHENSIVE METABOLIC PANEL - Abnormal; Notable for the following components:      Result Value   AST 78 (*)    ALT 117 (*)    All other components within normal limits  CBC     IMAGES: CTA Chest/abdomen 03/24/18 ([place]): FINDINGS: - There is no evidence of a dissection. The measurement of the ascending thoracic aorta as measured in the corrected axis of the vessel are as follows: - Aortic root at the level of the sinuses of Valsalva: 3.6 x 3.3 cm. Sinotubular junction: 3.0 x 3.0 cm. Mid ascending aorta: 4.1 x 3.8 cm Upper ascending aorta: 3.1 x 3.0 cm In the direct axial plane the mid ascending aorta measures 4.2 x 4.0 cm as measured on image 140 of series 5 with previously reported measurement of "4.3 x 3.8 cm." - The abdominal aorta is without aneurysm or dissection. Celiac, superior mesenteric, and inferior mesenteric arteries are widely patent. There are single renal arteries without stenosis. - Mediastinum: No mediastinal or hilar adenopathy. - Lungs: No infiltrates, nodules, or effusions. - Skeletal: No acute abnormality. - Upper Abdomen: Liver is without biliary ductal dilatation or mass. The spleen, pancreas, and adrenal glands are within normal limits. Gallbladder is nondistended. Kidneys are without hydronephrosis or mass. There is no retroperitoneal adenopathy. No  bowel dilatation. IMPRESSION:  Ectatic ascending thoracic aorta measuring 4.1 x 3.8 cm in the corrected plane of the vessel. In the direct axial plane the maximal measurement of the ascending aorta is 4.2 cm suggesting there has been no significant change from the previously reported measurement of "4.3 cm" on 02/28/2016.   EKG: 08/04/19:  NSR   CV: Nuclear stress test 02/29/16 (Care Everywhere): Impression  1. No reversible ischemia or infarction. 2. Normal left ventricular wall motion. 3. Left ventricular ejection fraction 60%. 4. Low-risk stress test findings.  Echo 02/29/16 (Care Everywhere):  Summary - Normal LV size and systolic function with no appreciable segmental abnormality. Ejection fraction is visually estimated at 71-69% - Normal Diastolic function - Trace mitral regurgitation - Mild Left atrial enlargement. - Aortic valve appears to be trileaflet. No aortic stenosis. No aortic regurgitation. - The aorta is within normal limits. Root dimension 3.7 cm.    Past Medical History:  Diagnosis Date  . Ascending aortic aneurysm ([place])    Is followed by Dr. Clydie Braun  . Elevated liver function tests     Past Surgical History:  Procedure Laterality Date  . ROTATOR CUFF REPAIR Right   . WRIST SURGERY Right    to remove ganglion cyst and scar tissue    MEDICATIONS: . ALPRAZolam (XANAX) 0.5 MG tablet  .  cyclobenzaprine (FLEXERIL) 10 MG tablet  . fluticasone (FLONASE) 50 MCG/ACT nasal spray  . ibuprofen (ADVIL) 200 MG tablet   No current facility-administered medications for this encounter.     Myra Gianotti, PA-C Surgical Short Stay/Anesthesiology [facility] Phone [phone] [facility] Phone [phone] 08/06/2019 12:53 PM

## 2019-08-06 ENCOUNTER — Encounter (HOSPITAL_COMMUNITY): Payer: Self-pay

## 2019-08-06 NOTE — Anesthesia Preprocedure Evaluation (Addendum)
Anesthesia Evaluation  Patient identified by MRN, date of birth, ID band Patient awake    Reviewed: Allergy & Precautions, NPO status , Patient's Chart, lab work & pertinent test results  Airway Mallampati: III  TM Distance: >3 FB Neck ROM: Full    Dental  (+) Teeth Intact, Dental Advisory Given   Pulmonary Patient abstained from smoking., former smoker,    breath sounds clear to auscultation       Cardiovascular  Rhythm:Regular Rate:Normal     Neuro/Psych    GI/Hepatic   Endo/Other    Renal/GU      Musculoskeletal   Abdominal   Peds  Hematology   Anesthesia Other Findings   Reproductive/Obstetrics                            Anesthesia Physical Anesthesia Plan  ASA: II  Anesthesia Plan: General   Post-op Pain Management:    Induction: Intravenous  PONV Risk Score and Plan: Ondansetron and Dexamethasone  Airway Management Planned: Oral ETT  Additional Equipment:   Intra-op Plan:   Post-operative Plan: Extubation in OR  Informed Consent: I have reviewed the patients History and Physical, chart, labs and discussed the procedure including the risks, benefits and alternatives for the proposed anesthesia with the patient or authorized representative who has indicated his/her understanding and acceptance.     Dental advisory given  Plan Discussed with: CRNA and Anesthesiologist  Anesthesia Plan Comments: (PAT note written 08/06/2019 by Myra Gianotti, PA-C. )       Anesthesia Quick Evaluation

## 2019-08-09 ENCOUNTER — Other Ambulatory Visit: Payer: Self-pay | Admitting: Otolaryngology

## 2019-08-09 ENCOUNTER — Other Ambulatory Visit (HOSPITAL_COMMUNITY)
Admission: RE | Admit: 2019-08-09 | Discharge: 2019-08-09 | Disposition: A | Discharge status: Home / Self Care | Payer: Commercial Managed Care - PPO | Source: Ambulatory Visit | Attending: Otolaryngology | Admitting: Otolaryngology

## 2019-08-09 DIAGNOSIS — Z01812 Encounter for preprocedural laboratory examination: Secondary | ICD-10-CM | POA: Insufficient documentation

## 2019-08-09 DIAGNOSIS — Z20828 Contact with and (suspected) exposure to other viral communicable diseases: Secondary | ICD-10-CM | POA: Diagnosis not present

## 2019-08-09 LAB — SARS CORONAVIRUS 2 (TAT 6-24 HRS): SARS Coronavirus 2: NEGATIVE

## 2019-08-11 ENCOUNTER — Ambulatory Visit (HOSPITAL_COMMUNITY): Payer: Commercial Managed Care - PPO | Admitting: Vascular Surgery

## 2019-08-11 ENCOUNTER — Ambulatory Visit (HOSPITAL_COMMUNITY)
Admission: RE | Admit: 2019-08-11 | Discharge: 2019-08-11 | Disposition: A | Discharge status: Home / Self Care | Payer: Commercial Managed Care - PPO | Attending: Otolaryngology | Admitting: Otolaryngology

## 2019-08-11 ENCOUNTER — Encounter (HOSPITAL_COMMUNITY)
Admission: RE | Disposition: A | Discharge status: Home / Self Care | Payer: Self-pay | Source: Home / Self Care | Attending: Otolaryngology

## 2019-08-11 ENCOUNTER — Encounter (HOSPITAL_COMMUNITY): Payer: Self-pay

## 2019-08-11 ENCOUNTER — Ambulatory Visit (HOSPITAL_COMMUNITY): Payer: Commercial Managed Care - PPO | Admitting: Anesthesiology

## 2019-08-11 ENCOUNTER — Other Ambulatory Visit: Payer: Self-pay

## 2019-08-11 DIAGNOSIS — Z79899 Other long term (current) drug therapy: Secondary | ICD-10-CM | POA: Diagnosis not present

## 2019-08-11 DIAGNOSIS — Z87891 Personal history of nicotine dependence: Secondary | ICD-10-CM | POA: Insufficient documentation

## 2019-08-11 DIAGNOSIS — J342 Deviated nasal septum: Secondary | ICD-10-CM | POA: Insufficient documentation

## 2019-08-11 DIAGNOSIS — I712 Thoracic aortic aneurysm, without rupture: Secondary | ICD-10-CM | POA: Insufficient documentation

## 2019-08-11 DIAGNOSIS — J343 Hypertrophy of nasal turbinates: Secondary | ICD-10-CM | POA: Diagnosis not present

## 2019-08-11 DIAGNOSIS — J329 Chronic sinusitis, unspecified: Secondary | ICD-10-CM | POA: Insufficient documentation

## 2019-08-11 HISTORY — PX: SINUS ENDO WITH FUSION: SHX5329

## 2019-08-11 HISTORY — PX: NASAL SEPTOPLASTY W/ TURBINOPLASTY: SHX2070

## 2019-08-11 HISTORY — PX: NASAL SINUS SURGERY: SHX719

## 2019-08-11 LAB — HEPATIC FUNCTION PANEL
ALT: 108 U/L — ABNORMAL HIGH (ref 0–44)
AST: 69 U/L — ABNORMAL HIGH (ref 15–41)
Albumin: 4.2 g/dL (ref 3.5–5.0)
Alkaline Phosphatase: 84 U/L (ref 38–126)
Bilirubin, Direct: 0.1 mg/dL (ref 0.0–0.2)
Indirect Bilirubin: 0.4 mg/dL (ref 0.3–0.9)
Total Bilirubin: 0.5 mg/dL (ref 0.3–1.2)
Total Protein: 7.7 g/dL (ref 6.5–8.1)

## 2019-08-11 SURGERY — SINUS SURGERY, ENDOSCOPIC
Anesthesia: General | Site: Nose | Laterality: Bilateral

## 2019-08-11 MED ORDER — OXYMETAZOLINE HCL 0.05 % NA SOLN
NASAL | Status: DC | PRN
  Administered 2019-08-11: 1

## 2019-08-11 MED ORDER — TRIAMCINOLONE ACETONIDE 40 MG/ML IJ SUSP
INTRAMUSCULAR | Status: AC
  Filled 2019-08-11: qty 5

## 2019-08-11 MED ORDER — OXYMETAZOLINE HCL 0.05 % NA SOLN
NASAL | Status: AC
  Filled 2019-08-11: qty 30

## 2019-08-11 MED ORDER — HYDROCODONE-ACETAMINOPHEN 5-325 MG PO TABS: 1.0000 | ORAL_TABLET | Freq: Two times a day (BID) | ORAL | 0 refills | Status: AC | PRN

## 2019-08-11 MED ORDER — ONDANSETRON HCL 4 MG/2ML IJ SOLN: 4.0000 mg | Freq: Once | INTRAMUSCULAR | Status: DC | PRN

## 2019-08-11 MED ORDER — PHENYLEPHRINE 40 MCG/ML (10ML) SYRINGE FOR IV PUSH (FOR BLOOD PRESSURE SUPPORT)
PREFILLED_SYRINGE | INTRAVENOUS | Status: DC | PRN
  Administered 2019-08-11 (×7): 80 ug via INTRAVENOUS

## 2019-08-11 MED ORDER — LIDOCAINE-EPINEPHRINE 1 %-1:100000 IJ SOLN
INTRAMUSCULAR | Status: DC | PRN
  Administered 2019-08-11: 8 mL

## 2019-08-11 MED ORDER — DIPHENHYDRAMINE HCL 50 MG/ML IJ SOLN
INTRAMUSCULAR | Status: DC | PRN
  Administered 2019-08-11: 25 mg via INTRAVENOUS

## 2019-08-11 MED ORDER — STERILE WATER FOR IRRIGATION IR SOLN
Status: DC | PRN
  Administered 2019-08-11: 1000 mL

## 2019-08-11 MED ORDER — MIDAZOLAM HCL 5 MG/5ML IJ SOLN
INTRAMUSCULAR | Status: DC | PRN
  Administered 2019-08-11: 2 mg via INTRAVENOUS

## 2019-08-11 MED ORDER — CEPHALEXIN 500 MG PO CAPS: 500.0000 mg | ORAL_CAPSULE | Freq: Three times a day (TID) | ORAL | 0 refills | Status: AC

## 2019-08-11 MED ORDER — LIDOCAINE 2% (20 MG/ML) 5 ML SYRINGE
INTRAMUSCULAR | Status: DC | PRN
  Administered 2019-08-11: 50 mg via INTRAVENOUS

## 2019-08-11 MED ORDER — MUPIROCIN 2 % EX OINT
TOPICAL_OINTMENT | CUTANEOUS | Status: DC | PRN
  Administered 2019-08-11: 1 via NASAL

## 2019-08-11 MED ORDER — FENTANYL CITRATE (PF) 100 MCG/2ML IJ SOLN: 25.0000 ug | INTRAMUSCULAR | Status: DC | PRN

## 2019-08-11 MED ORDER — MIDAZOLAM HCL 2 MG/2ML IJ SOLN
INTRAMUSCULAR | Status: AC
  Filled 2019-08-11: qty 2

## 2019-08-11 MED ORDER — PROPOFOL 10 MG/ML IV BOLUS
INTRAVENOUS | Status: DC | PRN
  Administered 2019-08-11: 200 mg via INTRAVENOUS

## 2019-08-11 MED ORDER — FENTANYL CITRATE (PF) 250 MCG/5ML IJ SOLN
INTRAMUSCULAR | Status: DC | PRN
  Administered 2019-08-11 (×2): 100 ug via INTRAVENOUS
  Administered 2019-08-11: 50 ug via INTRAVENOUS

## 2019-08-11 MED ORDER — PROPOFOL 10 MG/ML IV BOLUS
INTRAVENOUS | Status: AC
  Filled 2019-08-11: qty 40

## 2019-08-11 MED ORDER — SODIUM CHLORIDE 0.9 % IR SOLN
Status: DC | PRN
  Administered 2019-08-11: 1000 mL

## 2019-08-11 MED ORDER — FENTANYL CITRATE (PF) 250 MCG/5ML IJ SOLN
INTRAMUSCULAR | Status: AC
  Filled 2019-08-11: qty 5

## 2019-08-11 MED ORDER — OXYCODONE HCL 5 MG/5ML PO SOLN: 5.0000 mg | Freq: Once | ORAL | Status: DC | PRN

## 2019-08-11 MED ORDER — ROCURONIUM BROMIDE 10 MG/ML (PF) SYRINGE
PREFILLED_SYRINGE | INTRAVENOUS | Status: DC | PRN
  Administered 2019-08-11: 20 mg via INTRAVENOUS
  Administered 2019-08-11: 50 mg via INTRAVENOUS
  Administered 2019-08-11: 20 mg via INTRAVENOUS

## 2019-08-11 MED ORDER — LIDOCAINE-EPINEPHRINE 1 %-1:100000 IJ SOLN
INTRAMUSCULAR | Status: AC
  Filled 2019-08-11: qty 1

## 2019-08-11 MED ORDER — 0.9 % SODIUM CHLORIDE (POUR BTL) OPTIME
TOPICAL | Status: DC | PRN
  Administered 2019-08-11: 1000 mL

## 2019-08-11 MED ORDER — ONDANSETRON HCL 4 MG/2ML IJ SOLN
INTRAMUSCULAR | Status: DC | PRN
  Administered 2019-08-11: 4 mg via INTRAVENOUS

## 2019-08-11 MED ORDER — DEXTROSE 5 % IV SOLN
INTRAVENOUS | Status: DC | PRN
  Administered 2019-08-11: 3 g via INTRAVENOUS

## 2019-08-11 MED ORDER — LACTATED RINGERS IV SOLN
INTRAVENOUS | Status: DC | PRN
  Administered 2019-08-11 (×2): via INTRAVENOUS

## 2019-08-11 MED ORDER — SUGAMMADEX SODIUM 200 MG/2ML IV SOLN
INTRAVENOUS | Status: DC | PRN
  Administered 2019-08-11: 300 mg via INTRAVENOUS

## 2019-08-11 MED ORDER — OXYCODONE HCL 5 MG PO TABS: 5.0000 mg | ORAL_TABLET | Freq: Once | ORAL | Status: DC | PRN

## 2019-08-11 MED ORDER — DEXAMETHASONE SODIUM PHOSPHATE 10 MG/ML IJ SOLN
INTRAMUSCULAR | Status: DC | PRN
  Administered 2019-08-11: 10 mg via INTRAVENOUS

## 2019-08-11 MED ORDER — DEXMEDETOMIDINE HCL 200 MCG/2ML IV SOLN
INTRAVENOUS | Status: DC | PRN
  Administered 2019-08-11 (×2): 12 ug via INTRAVENOUS
  Administered 2019-08-11 (×2): 8 ug via INTRAVENOUS
  Administered 2019-08-11: 12 ug via INTRAVENOUS

## 2019-08-11 MED ORDER — MUPIROCIN 2 % EX OINT
TOPICAL_OINTMENT | CUTANEOUS | Status: AC
  Filled 2019-08-11: qty 22

## 2019-08-11 SURGICAL SUPPLY — 48 items
BLADE ROTATE RAD 40 4 M4 (BLADE) ×1 IMPLANT
BLADE ROTATE TRICUT 4X13 M4 (BLADE) ×2 IMPLANT
BLADE SURG 15 STRL LF DISP TIS (BLADE) ×1 IMPLANT
BLADE SURG 15 STRL SS (BLADE) ×2
BLADE TRICUT ROTATE M4 4 5PK (BLADE) ×2 IMPLANT
CANISTER SUCT 3000ML PPV (MISCELLANEOUS) ×4 IMPLANT
COAGULATOR SUCT 8FR VV (MISCELLANEOUS) IMPLANT
DRESSING NASAL KENNEDY 3.5X.9 (MISCELLANEOUS) IMPLANT
DRSG NASAL KENNEDY 3.5X.9 (MISCELLANEOUS)
ELECT REM PT RETURN 9FT ADLT (ELECTROSURGICAL) ×2
ELECTRODE REM PT RTRN 9FT ADLT (ELECTROSURGICAL) ×1 IMPLANT
FILTER ARTHROSCOPY CONVERTOR (FILTER) ×2 IMPLANT
GLOVE BIO SURGEON STRL SZ7 (GLOVE) ×2 IMPLANT
GLOVE BIOGEL M 7.0 STRL (GLOVE) ×4 IMPLANT
GLOVE BIOGEL PI IND STRL 7.0 (GLOVE) IMPLANT
GLOVE BIOGEL PI INDICATOR 7.0 (GLOVE) ×2
GOWN STRL REUS W/ TWL LRG LVL3 (GOWN DISPOSABLE) ×2 IMPLANT
GOWN STRL REUS W/TWL LRG LVL3 (GOWN DISPOSABLE) ×6
IV NS 1000ML (IV SOLUTION) ×2
IV NS 1000ML BAXH (IV SOLUTION) ×1 IMPLANT
KIT BASIN OR (CUSTOM PROCEDURE TRAY) ×2 IMPLANT
KIT TURNOVER KIT B (KITS) ×2 IMPLANT
NDL 18GX1X1/2 (RX/OR ONLY) (NEEDLE) IMPLANT
NDL HYPO 25GX1X1/2 BEV (NEEDLE) ×1 IMPLANT
NDL SPNL 25GX3.5 QUINCKE BL (NEEDLE) IMPLANT
NEEDLE 18GX1X1/2 (RX/OR ONLY) (NEEDLE) ×2 IMPLANT
NEEDLE HYPO 25GX1X1/2 BEV (NEEDLE) ×2 IMPLANT
NEEDLE SPNL 25GX3.5 QUINCKE BL (NEEDLE) ×2 IMPLANT
NS IRRIG 1000ML POUR BTL (IV SOLUTION) ×2 IMPLANT
PAD ARMBOARD 7.5X6 YLW CONV (MISCELLANEOUS) ×4 IMPLANT
PENCIL BUTTON HOLSTER BLD 10FT (ELECTRODE) IMPLANT
SOLUTION BUTLER CLEAR DIP (MISCELLANEOUS) ×1 IMPLANT
SPLINT NASAL DOYLE BI-VL (GAUZE/BANDAGES/DRESSINGS) ×2 IMPLANT
SPONGE NEURO XRAY DETECT 1X3 (DISPOSABLE) ×2 IMPLANT
SUT CHROMIC 5 0 P 3 (SUTURE) IMPLANT
SUT ETHILON 3 0 FSL (SUTURE) IMPLANT
SUT ETHILON 3 0 PS 1 (SUTURE) ×2 IMPLANT
SUT PLAIN 4 0 ~~LOC~~ 1 (SUTURE) ×2 IMPLANT
SYR CONTROL 10ML LL (SYRINGE) ×2 IMPLANT
TOWEL GREEN STERILE FF (TOWEL DISPOSABLE) ×2 IMPLANT
TRACKER ENT INSTRUMENT (MISCELLANEOUS) ×2 IMPLANT
TRACKER ENT PATIENT (MISCELLANEOUS) ×2 IMPLANT
TRAY ENT MC OR (CUSTOM PROCEDURE TRAY) ×2 IMPLANT
TUBE CONNECTING 12X1/4 (SUCTIONS) ×2 IMPLANT
TUBE SALEM SUMP 16 FR W/ARV (TUBING) ×2 IMPLANT
TUBING EXTENTION W/L.L. (IV SETS) ×2 IMPLANT
TUBING STRAIGHTSHOT EPS 5PK (TUBING) ×2 IMPLANT
WATER STERILE IRR 1000ML POUR (IV SOLUTION) ×2 IMPLANT

## 2019-08-11 NOTE — Transfer of Care (Signed)
Immediate Anesthesia Transfer of Care Note  Patient: Phillip Tran  Procedure(s) Performed: BILATERAL ENDOSCOPIC SINUS SURGERY (Bilateral Nose) NASAL SEPTOPLASTY WITH TURBINATE REDUCTION (Bilateral Nose) SINUS ENDO WITH FUSION (Bilateral Nose)  Patient Location: PACU  Anesthesia Type:General  Level of Consciousness: awake, alert  and oriented  Airway & Oxygen Therapy: Patient Spontanous Breathing and Patient connected to face mask oxygen  Post-op Assessment: Report given to RN, Post -op Vital signs reviewed and stable and Patient moving all extremities X 4  Post vital signs: Reviewed and stable  Last Vitals:  Vitals Value Taken Time  BP 96/68 08/11/19 1207  Temp    Pulse 79 08/11/19 1209  Resp 22 08/11/19 1209  SpO2 94 % 08/11/19 1209  Vitals shown include unvalidated device data.  Last Pain:  Vitals:   08/11/19 0848  TempSrc:   PainSc: 0-No pain      Patients Stated Pain Goal: 4 (18/84/16 6063)  Complications: No apparent anesthesia complications

## 2019-08-11 NOTE — Anesthesia Procedure Notes (Signed)
Procedure Name: Intubation Date/Time: 08/11/2019 10:12 AM Performed by: Mariea Clonts, CRNA Pre-anesthesia Checklist: Patient identified, Emergency Drugs available, Suction available and Patient being monitored Patient Re-evaluated:Patient Re-evaluated prior to induction Oxygen Delivery Method: Circle System Utilized Preoxygenation: Pre-oxygenation with 100% oxygen Induction Type: IV induction Ventilation: Mask ventilation without difficulty and Oral airway inserted - appropriate to patient size Laryngoscope Size: Mac and 4 Grade View: Grade II Tube type: Oral Tube size: 8.0 mm Number of attempts: 1 Airway Equipment and Method: Stylet and Oral airway Placement Confirmation: ETT inserted through vocal cords under direct vision,  positive ETCO2 and breath sounds checked- equal and bilateral Tube secured with: Tape Dental Injury: Teeth and Oropharynx as per pre-operative assessment

## 2019-08-11 NOTE — Op Note (Signed)
Operative Note:  ENDOSCOPIC SINUS SURGERY WITH NAVIGATION    SEPTOPLASTY    INFERIOR TURBINATE REDUCTION  Patient: Phillip Tran  Medical record number: [id number]  Date:08/11/2019  Pre-operative Indications: 1.  Chronic Sinusitis     2.  Severe nasal septal deviation     3.  Bilateral inferior turbinate hypertrophy  Postoperative Indications: Same  Surgical Procedure: 1.  Bilateral endoscopic sinus surgery with computer-assisted navigation (Fusion) consisting of: Bilateral total ethmoidectomy, bilateral nasal frontal recess exploration and bilateral maxillary antrostomy with removal of diseased tissue    2.  Nasal septoplasty    3. Bilateral Inferior Turbinate Reduction  Anesthesia: GET  Surgeon: Delsa Bern, M.D.  Complications: None  EBL: 100 cc  Findings: Diffuse mucosal disease involving the maxillary, ethmoid and frontal sinuses, no purulent discharge or evidence of ongoing infection.  Severely deviated nasal septum with near complete left-sided nasal airway obstruction and bilateral inferior turbinate hypertrophy.  No nasal packing placed.  Bilateral Doyle nasal septal splints placed at the conclusion of the surgical procedure.   Brief History: The patient is a 43 y.o. male with a history of chronic sinusitis and turbinate hypertrophy. The patient has been on medical therapy to reduce nasal mucosal edema and infection including antibiotics, saline nasal spray and topical nasal steroids. Despite appropriate medical therapy the patient continues to have ongoing symptoms. Given the patient's history and findings, the above surgical procedures were recommended, risks and benefits were discussed in detail with the patient may understand and agree with our plan for surgery which is scheduled at [facility] under general anesthesia as an outpatient.  Surgical Procedure: The patient is brought to the operating room on 08/11/2019 and placed in supine position on the  operating table. General endotracheal anesthesia was established without difficulty. When the patient was adequately anesthetized, surgical timeout was performed with correct identification of the patient and the surgical procedure. The patient's nose was then injected with 8 cc of 1% lidocaine 1:100,000 dilution epinephrine which was injected in a submucosal fashion. The patient's nose was then packed with Afrin-soaked cottonoid pledgets were left in place for approximately 10 minutes to allow for  vasoconstriction and hemostasis.  The Xomed Fusion navigation headgear was applied in anatomic and surgical landmarks were identified and confirmed, navigation was used throughout the sinus component of the surgical procedure.  With the patient prepped draped and prepared for surgery, nasal endoscopy was performed on the right side.  Using a 0 degree endoscope and a straight microdebrider, a total ethmoidectomy was performed dissecting from anterior to posterior along the floor of the ethmoid sinus removing bony septations and diseased mucosa.  Using a 45 degree telescope and a curved microdebrider dissection was then carried out along the roof of the ethmoid sinus with navigation, completing a total ethmoidectomy.  Attention was then turned to the nasal frontal recess and again using a curved microdebrider, navigation and endoscopic visualization the nasal frontal recess was widely open, underlying ethmoid cells and diseased mucosa resected creating a widely patent nasal frontal recess.  The lateral nasal wall was inspected, uncinate process was resected using a through-cutting forcep and the natural ostium of the maxillary sinus was enlarged in a posterior and inferior direction.  Within the maxillary sinus thick mucoid material and polypoid soft tissue was resected.    The patient's left side was then inspected and total ethmoidectomy was performed using a 0 degree telescope and straight microdebrider along the  floor of the ethmoid sinus,  a 45 degree telescope and curved microdebrider with navigation was used along the roof the ethmoid sinus from posterior to anterior to complete a total ethmoidectomy.  The nasal frontal recess was then explored and underlying ethmoid disease was resected with a curved microdebrider, the nasal frontal recess was widely opened and diseased material was resected from within the sinus.  Attention was then turned to the lateral nasal wall, the natural ostium maxillary sinus was identified. The uncinate process was resected.  Diseased mucosa from within the maxilla sinus was then cleared and the ostium was enlarged in a posterior and inferior direction.     A left anterior hemitransfixion incision was created and a mucoperichondrial flap was elevated from anterior to posterior on the left-hand side. The anterior cartilaginous septum was crossed at the midline and a mucoperichondrial flap was elevated on the patient's right.  Swivel knife was then used to resect the anterior and mid cartilaginous portion of the nasal septum.  Resected cartilage was morcellized and returned to the mucoperichondrial pocket at the occlusion of the surgical procedure.  Dissection was then carried out from anterior to posterior removing deviated bone and cartilage including a large septal spur the overlying mucosa was preserved.  With the septum brought to good midline position, the morselized cartilage was returned to the mucoperichondrial pocket and the soft tissue/mucosal flaps were reapproximated with interrupted 4-0 gut suture on a Keith needle in a horizontal mattressing fashion.  Anterior hemitransfixion incision was closed with the same stitch.  Bilateral Doyle nasal septal splints were then placed after the application of Bactroban ointment and sutured in position with a 3-0 Ethilon suture.  Attention was then turned to the inferior turbinates, bilateral inferior turbinate intramural cautery was  performed with cautery setting at 12 W.  2 submucosal passes were made in each inferior turbinate.  After completing cautery, anterior vertical incisions were created and overlying soft tissue was elevated, a small amount of turbinate bone was resected.  The turbinates were then outfractured to create a more patent nasal passageway.  No nasal packing was placed in the common ethmoid cavity bilaterally.  Surgical sponge count was correct. An oral gastric tube was passed and the stomach contents were aspirated. Patient was awakened from anesthetic and transferred from the operating room to the recovery room in stable condition. There were no complications and blood loss was 100 cc.   Barbee Cough, M.D. [facility] 08/11/2019

## 2019-08-11 NOTE — H&P (Signed)
Phillip Tran is an 43 y.o. male.   Chief Complaint: Nasal obstruction HPI: Deviated septum and obstruction, recurent infection  Past Medical History:  Diagnosis Date  . Ascending aortic aneurysm ([place])    Is followed by Dr. Clydie Braun  . Elevated liver function tests     Past Surgical History:  Procedure Laterality Date  . ROTATOR CUFF REPAIR Right   . WRIST SURGERY Right    to remove ganglion cyst and scar tissue    Family History  Problem Relation Age of Onset  . Scoliosis Father   . Arthritis/Rheumatoid Father    Social History:  reports that he quit smoking about 10 years ago. His smokeless tobacco use includes chew. He reports current alcohol use of about 21.0 standard drinks of alcohol per week. He reports that he does not use drugs.  Allergies: No Known Allergies  Medications Prior to Admission  Medication Sig Dispense Refill  . ALPRAZolam (XANAX) 0.5 MG tablet Take 0.5 mg by mouth daily as needed for anxiety.  5  . fluticasone (FLONASE) 50 MCG/ACT nasal spray Place 2 sprays into both nostrils 2 (two) times daily.    Marland Kitchen ibuprofen (ADVIL) 200 MG tablet Take 800 mg by mouth every 8 (eight) hours as needed (pain).    . cyclobenzaprine (FLEXERIL) 10 MG tablet Take 1 tablet (10 mg total) by mouth 3 (three) times daily as needed for muscle spasms. 30 tablet 0    Results for orders placed or performed during the hospital encounter of 08/09/19 (from the past 48 hour(s))  SARS CORONAVIRUS 2 (TAT 6-24 HRS) Nasopharyngeal Nasopharyngeal Swab     Status: None   Collection Time: 08/09/19 10:08 AM   Specimen: Nasopharyngeal Swab  Result Value Ref Range   SARS Coronavirus 2 NEGATIVE NEGATIVE    Comment: (NOTE) SARS-CoV-2 target nucleic acids are NOT DETECTED. The SARS-CoV-2 RNA is generally detectable in upper and lower respiratory specimens during the acute phase of infection. Negative results do not preclude SARS-CoV-2 infection, do not rule out co-infections with other  pathogens, and should not be used as the sole basis for treatment or other patient management decisions. Negative results must be combined with clinical observations, patient history, and epidemiological information. The expected result is Negative. Fact Sheet for Patients: SugarRoll.be Fact Sheet for Healthcare Providers: https://www.woods-mathews.com/ This test is not yet approved or cleared by the [place] FDA and  has been authorized for detection and/or diagnosis of SARS-CoV-2 by FDA under an Emergency Use Authorization (EUA). This EUA will remain  in effect (meaning this test can be used) for the duration of the COVID-19 declaration under Section 56 4(b)(1) of the Act, 21 U.S.C. section 360bbb-3(b)(1), unless the authorization is terminated or revoked sooner. Performed at [facility], [place] [address], [place], [place] [address]    No results found.  Review of Systems  Constitutional: Negative.   HENT: Positive for congestion and sinus pain.   Respiratory: Negative.   Cardiovascular: Negative.     Blood pressure (!) 134/95, pulse 88, temperature 98.4 F (36.9 C), temperature source Oral, resp. rate 20, height 6' (1.829 m), weight 124.4 kg, SpO2 96 %. Physical Exam  Constitutional: He appears well-developed and well-nourished.  HENT:  Deviated septum and obstruction  Neck: Normal range of motion. Neck supple.     Assessment/Plan Adm for OP ESS with Fusion, septoplasty and IT reduction.  Jerrell Belfast, MD 08/11/2019, 9:38 AM

## 2019-08-12 ENCOUNTER — Encounter (HOSPITAL_COMMUNITY): Payer: Self-pay | Admitting: Otolaryngology

## 2019-08-12 LAB — SURGICAL PATHOLOGY

## 2019-08-12 NOTE — Anesthesia Postprocedure Evaluation (Signed)
Anesthesia Post Note  Patient: Phillip Tran  Procedure(s) Performed: BILATERAL ENDOSCOPIC SINUS SURGERY (Bilateral Nose) NASAL SEPTOPLASTY WITH TURBINATE REDUCTION (Bilateral Nose) SINUS ENDO WITH FUSION (Bilateral Nose)     Patient location during evaluation: PACU Anesthesia Type: General Level of consciousness: awake and alert Pain management: pain level controlled Vital Signs Assessment: post-procedure vital signs reviewed and stable Respiratory status: spontaneous breathing, nonlabored ventilation, respiratory function stable and patient connected to nasal cannula oxygen Cardiovascular status: blood pressure returned to baseline and stable Postop Assessment: no apparent nausea or vomiting Anesthetic complications: no    Last Vitals:  Vitals:   08/11/19 1230 08/11/19 1233  BP:  120/74  Pulse: 86 79  Resp: 20 (!) 22  Temp: 36.4 C   SpO2: 96% 96%    Last Pain:  Vitals:   08/11/19 1230  TempSrc:   PainSc: 4                  Abdo Denault COKER

## 2020-09-15 ENCOUNTER — Encounter: Payer: Self-pay | Admitting: Internal Medicine

## 2020-09-15 ENCOUNTER — Other Ambulatory Visit: Payer: Self-pay

## 2020-09-15 ENCOUNTER — Ambulatory Visit: Payer: Commercial Managed Care - PPO | Admitting: Internal Medicine

## 2020-09-15 VITALS — BP 130/100 | HR 78 | Ht 72.0 in | Wt 269.0 lb

## 2020-09-15 DIAGNOSIS — I712 Thoracic aortic aneurysm, without rupture, unspecified: Secondary | ICD-10-CM

## 2020-09-15 NOTE — Progress Notes (Signed)
Cardiology Office Note:    Date:  09/15/2020   ID:  Phillip Tran, DOB [birth year], MRN [id number]  PCP:  Geoffry Paradise, MD  [facility] Cardiologist:  Christell Constant, MD  [facility] Electrophysiologist:  None   CC: Palpitations Consulted for the evaluation of thoracic aortic aneurysm at the behest of Geoffry Paradise, MD  History of Present Illness:    Phillip Tran is a 44 y.o. male with a hx of ascending aortic aneurysm found 02/28/2016 at [facility] with a diameter of 4.3 cm (4.2 cm in 2019),  HLD (not on treatment because his wife told him no)  And BMI 36 and prior hx of transaminitis NOS, who presents with palpitations.   Provider Note:  Wife is OR Nurse  Patient notes that every so often he feels occasional heart flutters.  Worse with stress.  Has much improved with Xanax.  Patient notes he hasn't had them in over a year.  No change with activity.  No chest or back or abdominal pain, no syncope, or near syncope.  No aortic dissection, SCD, drowning's or car accidents in family.  Past Medical History:  Diagnosis Date  . Allergic rhinitis   . Anxiety   . Ascending aortic aneurysm ([facility])    Is followed by Dr. Sharol Roussel  . Elevated liver function tests   . Hives   . Hyperlipidemia   . Obesity   . Thoracic aortic aneurysm without rupture ([facility])   . Urticaria    Past Surgical History:  Procedure Laterality Date  . NASAL SEPTOPLASTY W/ TURBINOPLASTY Bilateral 08/11/2019   Procedure: NASAL SEPTOPLASTY WITH TURBINATE REDUCTION;  Surgeon: Osborn Coho, MD;  Location: [facility] OR;  Service: ENT;  Laterality: Bilateral;  . NASAL SINUS SURGERY Bilateral 08/11/2019   Procedure: BILATERAL ENDOSCOPIC SINUS SURGERY;  Surgeon: Osborn Coho, MD;  Location: [facility] OR;  Service: ENT;  Laterality: Bilateral;  . ROTATOR CUFF REPAIR Right   . SINUS ENDO WITH FUSION Bilateral 08/11/2019   Procedure: SINUS ENDO WITH FUSION;  Surgeon: Osborn Coho, MD;  Location: [facility] OR;   Service: ENT;  Laterality: Bilateral;  . WRIST SURGERY Right    to remove ganglion cyst and scar tissue    Current Medications: Current Meds  Medication Sig  . ALPRAZolam (XANAX) 0.5 MG tablet Take 0.5 mg by mouth daily as needed for anxiety.  . cyclobenzaprine (FLEXERIL) 10 MG tablet Take 1 tablet (10 mg total) by mouth 3 (three) times daily as needed for muscle spasms.  . fluticasone (FLONASE) 50 MCG/ACT nasal spray Place 2 sprays into both nostrils 2 (two) times daily.  Marland Kitchen ibuprofen (ADVIL) 200 MG tablet Take 800 mg by mouth every 8 (eight) hours as needed (pain).    Allergies:   Levaquin [levofloxacin]   Social History   Socioeconomic History  . Marital status: Married    Spouse name: Not on file  . Number of children: Not on file  . Years of education: Not on file  . Highest education level: Not on file  Occupational History  . Not on file  Tobacco Use  . Smoking status: Former Smoker    Quit date: 08/03/2009    Years since quitting: 11.1  . Smokeless tobacco: Current User    Types: Chew  Vaping Use  . Vaping Use: Never used  Substance and Sexual Activity  . Alcohol use: Yes    Alcohol/week: 21.0 standard drinks    Types: 21 Standard drinks or equivalent per week  Comment: up to 4-5 mixed drinks/night ('brown liquor" + Coke) 08/05/2019  . Drug use: Never  . Sexual activity: Not on file  Other Topics Concern  . Not on file  Social History Narrative  . Not on file   Social Determinants of Health   Financial Resource Strain:   . Difficulty of Paying Living Expenses: Not on file  Food Insecurity:   . Worried About Programme researcher, broadcasting/film/video in the Last Year: Not on file  . Ran Out of Food in the Last Year: Not on file  Transportation Needs:   . Lack of Transportation (Medical): Not on file  . Lack of Transportation (Non-Medical): Not on file  Physical Activity:   . Days of Exercise per Week: Not on file  . Minutes of Exercise per Session: Not on file  Stress:   .  Feeling of Stress : Not on file  Social Connections:   . Frequency of Communication with Friends and Family: Not on file  . Frequency of Social Gatherings with Friends and Family: Not on file  . Attends Religious Services: Not on file  . Active Member of Clubs or Organizations: Not on file  . Attends Banker Meetings: Not on file  . Marital Status: Not on file    Family History: The patient's family history includes Arthritis/Rheumatoid in his father; Scoliosis in his father.  ROS:   Please see the history of present illness.    All other systems reviewed and are negative.  EKGs/Labs/Other Studies Reviewed:    The following studies were reviewed today:  EKG:   EKG is ordered today.  The ekg ordered today demonstrates SR rate 78 with LVH.  Recent Labs: Labs done 06/27/20 at [facility] AST/ALT 44/62 (improved from prior) Creatine 0.9 Hgb 16.1 Plt 222 TSH 1.24  Physical Exam:    VS:  BP (!) 130/100   Pulse 78   Ht 6' (1.829 m)   Wt 269 lb (122 kg)   BMI 36.48 kg/m     Wt Readings from Last 3 Encounters:  09/15/20 269 lb (122 kg)  08/11/19 274 lb 4.8 oz (124.4 kg)  08/04/19 274 lb 4.8 oz (124.4 kg)    GEN: Obese well developed in no acute distress HEENT: Normal NECK: No JVD; No carotid bruits LYMPHATICS: No lymphadenopathy CARDIAC: RRR, no murmurs, rubs, gallops RESPIRATORY:  Clear to auscultation without rales, wheezing or rhonchi  ABDOMEN: Soft, non-tender, non-distended MUSCULOSKELETAL:  No edema; No deformity  SKIN: Warm and dry NEUROLOGIC:  Alert and oriented x 3 PSYCHIATRIC:  Normal affect   ASSESSMENT:    1. Thoracic aortic aneurysm without rupture (HCC)    PLAN:    In order of problems listed above:  Asymptomatic thoracic/abdominal aortic aneurysm HLD and Morbid Obesity - Last at 4.3 cm, rate of growth neglibile, indexed to height 2.35; over all mild dilation in 2017 - Last scan 2017; will need CT Chest Aorta  -  Will get echocardiogram to look for AV and other surgical indications  - Primary Prevention:  HTN, Smoking Cessation  - 1st degree relative one time screening  - Discussed not using Fluoroquinolones  1 year follow up unless new symptoms or abnormal test results warranting change in plan  Would be reasonable for Virtual Follow up  Medication Adjustments/Labs and Tests Ordered: Current medicines are reviewed at length with the patient today.  Concerns regarding medicines are outlined above.  Orders Placed This Encounter  Procedures  . CT ANGIO  CHEST AORTA W/CM & OR WO/CM  . Basic metabolic panel  . EKG 12-Lead  . ECHOCARDIOGRAM COMPLETE   No orders of the defined types were placed in this encounter.   Patient Instructions  Medication Instructions:  No changes *If you need a refill on your cardiac medications before your next appointment, please call your pharmacy*   Lab Work: Today: BMET  If you have labs (blood work) drawn today and your tests are completely normal, you will receive your results only by: Marland Kitchen MyChart Message (if you have MyChart) OR . A paper copy in the mail If you have any lab test that is abnormal or we need to change your treatment, we will call you to review the results.   Testing/Procedures: Your physician has requested that you have an echocardiogram. Echocardiography is a painless test that uses sound waves to create images of your heart. It provides your doctor with information about the size and shape of your heart and how well your heart's chambers and valves are working. This procedure takes approximately one hour. There are no restrictions for this procedure.  Non-Cardiac CT Angiography (CTA), is a special type of CT scan that uses a computer to produce multi-dimensional views of major blood vessels throughout the body. In CT angiography, a contrast material is injected through an IV to help visualize the blood vessels   Follow-Up: At [facility], you and your health needs are our priority.  As part of our continuing mission to provide you with exceptional heart care, we have created designated Provider Care Teams.  These Care Teams include your primary Cardiologist (physician) and Advanced Practice Providers (APPs -  Physician Assistants and Nurse Practitioners) who all work together to provide you with the care you need, when you need it.  Your next appointment:   12 month(s)  The format for your next appointment:   In Person  Provider:   Riley Lam, MD   Other Instructions      Signed, Christell Constant, MD  09/15/2020 3:40 PM    [facility]

## 2020-09-15 NOTE — Patient Instructions (Addendum)
Medication Instructions:  No changes *If you need a refill on your cardiac medications before your next appointment, please call your pharmacy*   Lab Work: Today: BMET  If you have labs (blood work) drawn today and your tests are completely normal, you will receive your results only by: Marland Kitchen MyChart Message (if you have MyChart) OR . A paper copy in the mail If you have any lab test that is abnormal or we need to change your treatment, we will call you to review the results.   Testing/Procedures: Your physician has requested that you have an echocardiogram. Echocardiography is a painless test that uses sound waves to create images of your heart. It provides your doctor with information about the size and shape of your heart and how well your heart's chambers and valves are working. This procedure takes approximately one hour. There are no restrictions for this procedure.  Non-Cardiac CT Angiography (CTA), is a special type of CT scan that uses a computer to produce multi-dimensional views of major blood vessels throughout the body. In CT angiography, a contrast material is injected through an IV to help visualize the blood vessels   Follow-Up: At [facility], you and your health needs are our priority.  As part of our continuing mission to provide you with exceptional heart care, we have created designated Provider Care Teams.  These Care Teams include your primary Cardiologist (physician) and Advanced Practice Providers (APPs -  Physician Assistants and Nurse Practitioners) who all work together to provide you with the care you need, when you need it.  Your next appointment:   12 month(s)  The format for your next appointment:   In Person  Provider:   Riley Lam, MD   Other Instructions

## 2020-09-16 LAB — BASIC METABOLIC PANEL
BUN/Creatinine Ratio: 11 (ref 9–20)
BUN: 9 mg/dL (ref 6–24)
CO2: 24 mmol/L (ref 20–29)
Calcium: 10 mg/dL (ref 8.7–10.2)
Chloride: 102 mmol/L (ref 96–106)
Creatinine, Ser: 0.79 mg/dL (ref 0.76–1.27)
GFR calc Af Amer: 129 mL/min/{1.73_m2} (ref >59)
GFR calc non Af Amer: 112 mL/min/{1.73_m2} (ref >59)
Glucose: 97 mg/dL (ref 65–99)
Potassium: 4.4 mmol/L (ref 3.5–5.2)
Sodium: 142 mmol/L (ref 134–144)

## 2020-10-05 ENCOUNTER — Ambulatory Visit (INDEPENDENT_AMBULATORY_CARE_PROVIDER_SITE_OTHER): Payer: Commercial Managed Care - PPO

## 2020-10-05 ENCOUNTER — Other Ambulatory Visit: Payer: Self-pay

## 2020-10-05 DIAGNOSIS — I712 Thoracic aortic aneurysm, without rupture, unspecified: Secondary | ICD-10-CM

## 2020-10-05 LAB — ECHOCARDIOGRAM COMPLETE: S' Lateral: 3.7 cm

## 2020-10-06 ENCOUNTER — Ambulatory Visit (INDEPENDENT_AMBULATORY_CARE_PROVIDER_SITE_OTHER)
Admission: RE | Admit: 2020-10-06 | Discharge: 2020-10-06 | Disposition: A | Discharge status: Home / Self Care | Payer: Commercial Managed Care - PPO | Source: Ambulatory Visit | Attending: Internal Medicine | Admitting: Internal Medicine

## 2020-10-06 DIAGNOSIS — I712 Thoracic aortic aneurysm, without rupture, unspecified: Secondary | ICD-10-CM

## 2020-10-06 MED ORDER — IOHEXOL 350 MG/ML SOLN
100.0000 mL | Freq: Once | INTRAVENOUS | Status: AC | PRN
  Administered 2020-10-06: 100 mL via INTRAVENOUS

## 2021-03-02 IMAGING — CT CT ANGIO CHEST
3 of 8 series · 18 of 46 positions shown · IV contrast (OMNIPAQUE 350)
Comparison: 02/28/2016

CLINICAL DATA: Thoracic aortic aneurysm, follow-up, currently
asymptomatic

EXAM:
CT ANGIOGRAPHY CHEST WITH CONTRAST
TECHNIQUE: Multidetector CT imaging of the chest was performed using the
standard protocol during bolus administration of intravenous
contrast. Multiplanar CT image reconstructions and MIPs were
obtained to evaluate the vascular anatomy.
CONTRAST:  100mL OMNIPAQUE IOHEXOL 350 MG/ML SOLN

[Series 4: aorta 3.0 bf37 2 · axial · 0.84mm/px · z∈[+1061,+1310]mm · 13 of 97 slices shown]
[im 7/97  lung]
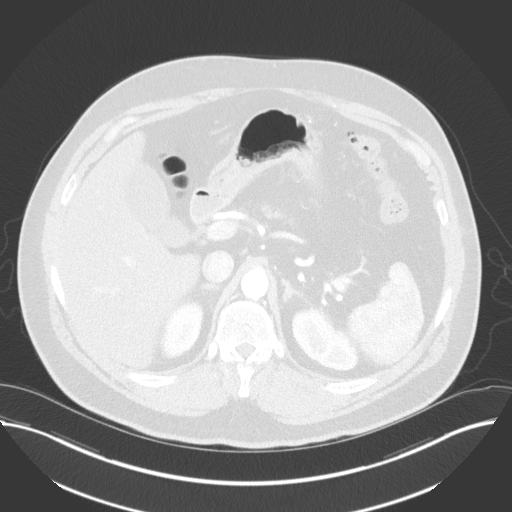
[im 14/97  soft-tissue]
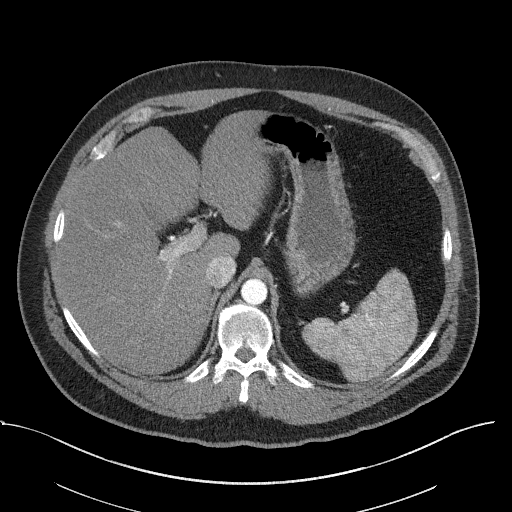
[im 21/97  lung]
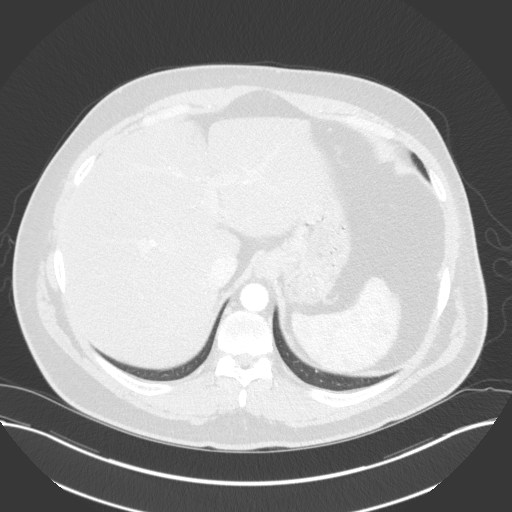
[im 28/97  soft-tissue]
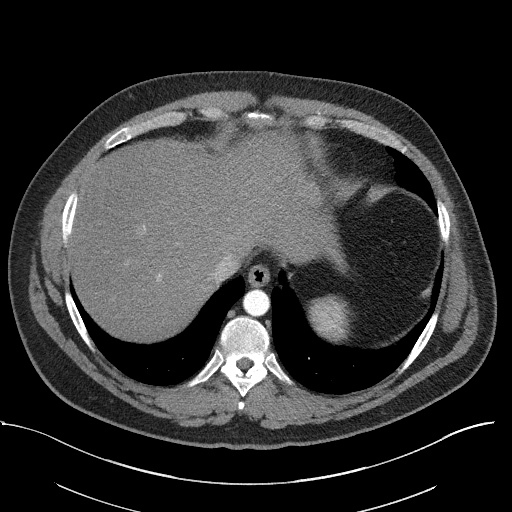
[im 35/97  lung]
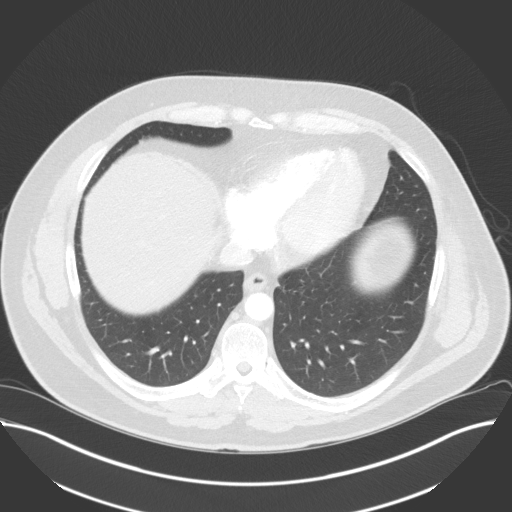
[im 42/97  soft-tissue]
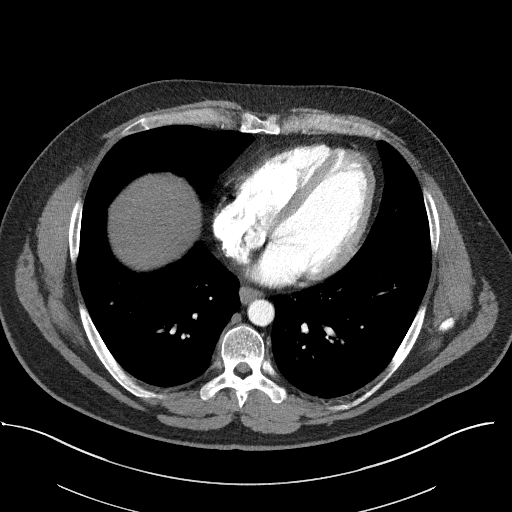
[im 49/97  lung]
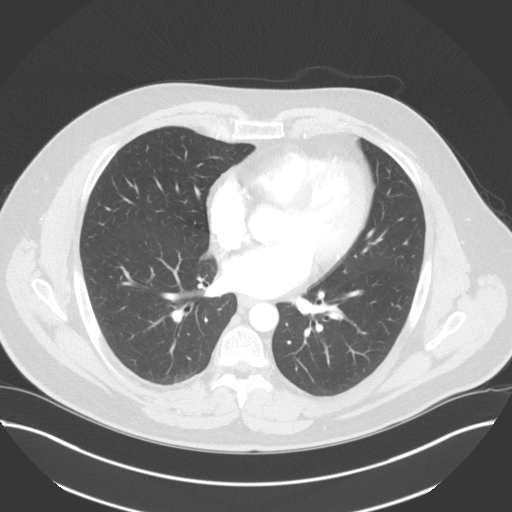
[im 55/97  soft-tissue]
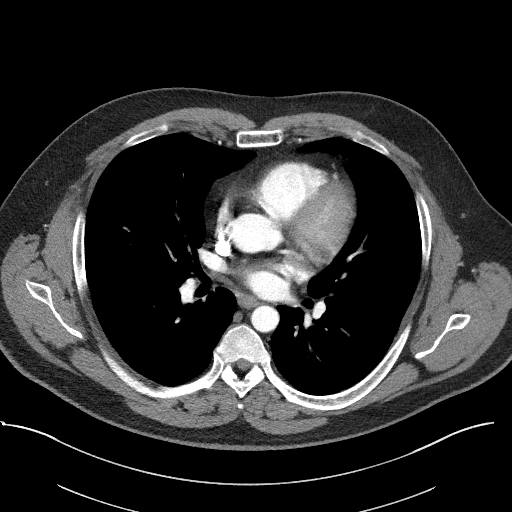
[im 62/97  lung]
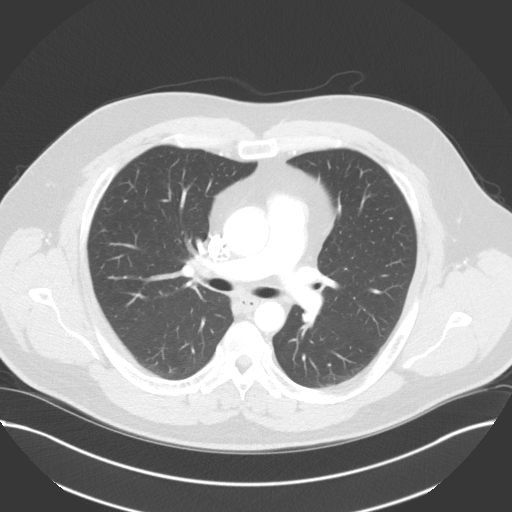
[im 69/97  soft-tissue]
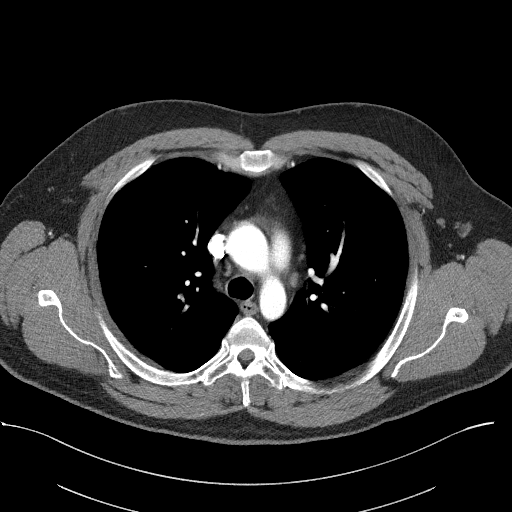
[im 76/97  lung]
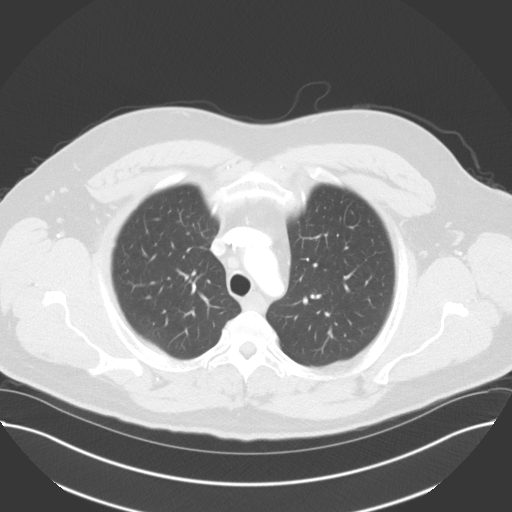
[im 83/97  soft-tissue]
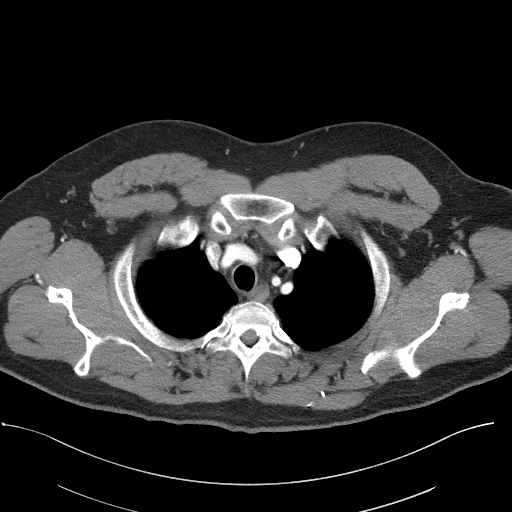
[im 90/97  lung]
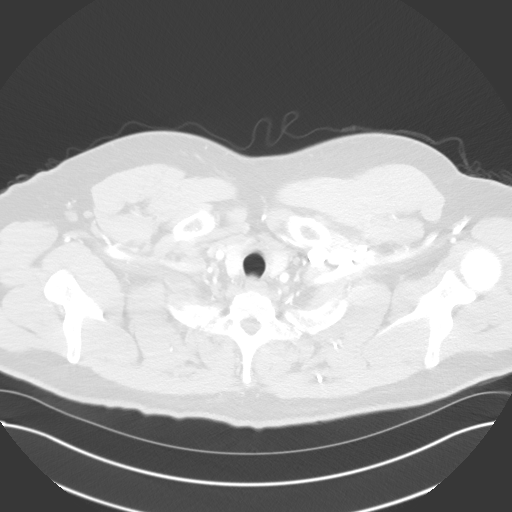

[Series 5: lung · axial · 0.84mm/px · z∈[+1061,+1103]mm · 2 of 97 slices shown]
[im 7/97  soft-tissue]
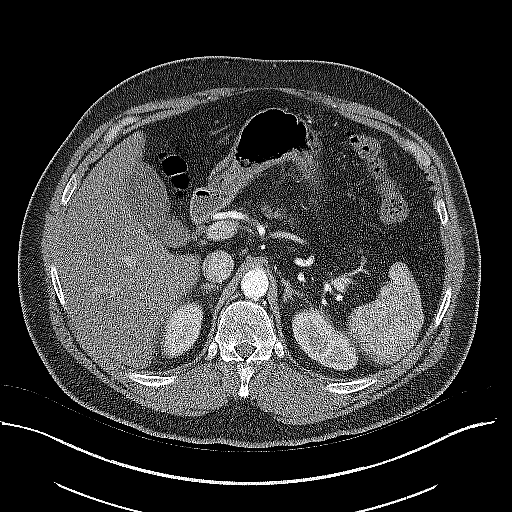
[im 21/97  soft-tissue]
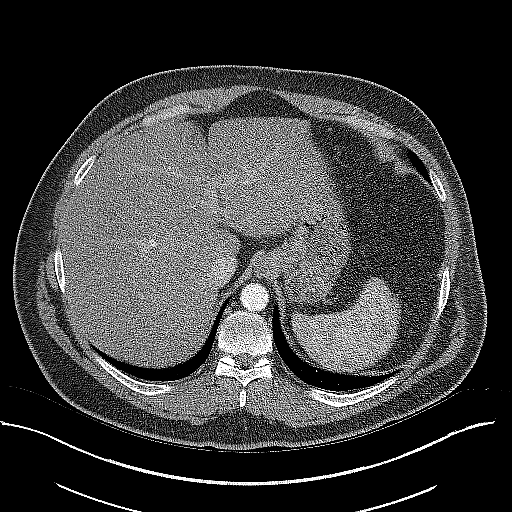

[Series 7: coronals · coronal · 0.58mm/px · 3 of 151 slices shown]
[im 38/151  soft-tissue]
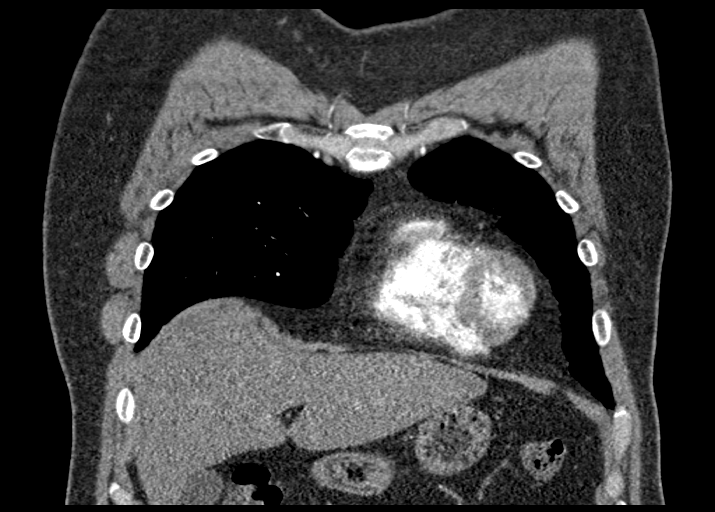
[im 76/151  soft-tissue]
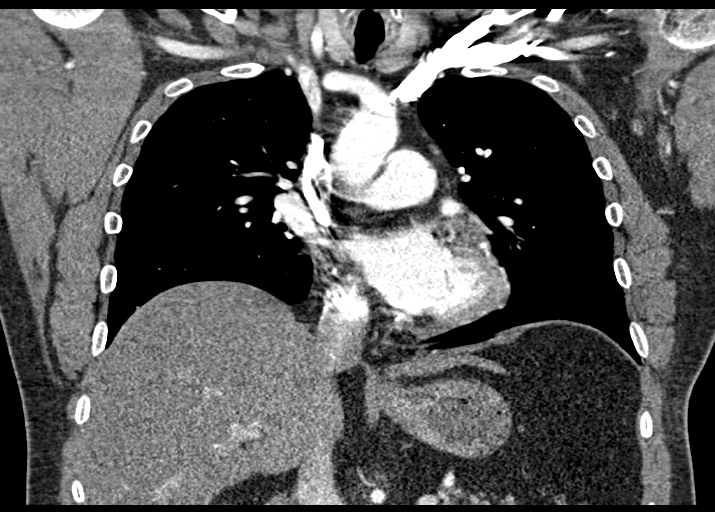
[im 113/151  soft-tissue]
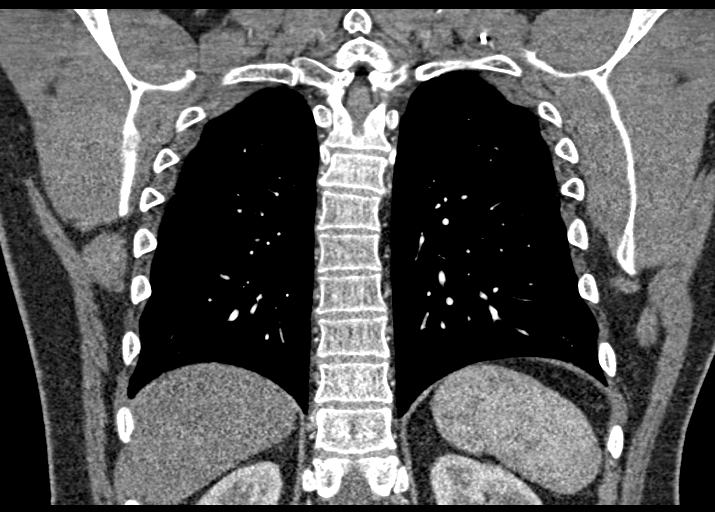

[18 of 46 positions shown; findings below may reference images not displayed]

FINDINGS: Cardiovascular: Heart size normal. No pericardial effusion. Central
pulmonary arteries unremarkable. The exam was not optimized for
detection of pulmonary emboli. Good contrast opacification of the
thoracic aorta. No dissection or stenosis.

Aortic Root:

--Valve: 2.8 cm

--Sinuses: 3.5 cm

--Sinotubular Junction: 2.7 cm

Limitations by motion: Moderate

Thoracic Aorta:

--Ascending Aorta: 3.8 cm (stable mild by my measurement since prior
study)

--Aortic Arch: 3.3 cm (previously 3.2)

--Descending Aorta:

No significant atheromatous change. Bovine variant brachiocephalic
arterial origin anatomy without proximal stenosis. Visualized
proximal abdominal aorta unremarkable.

Mediastinum/Nodes: No mass or adenopathy.

Lungs/Pleura: No pleural effusion. No pneumothorax. Lungs are clear.

Upper Abdomen: No acute abnormality.

Musculoskeletal: No chest wall abnormality. No acute or significant
osseous findings.

Review of the MIP images confirms the above findings.
IMPRESSION: 1. Stable borderline dilatation of aortic arch. Recommend annual
imaging followup by CTA or MRA. This recommendation follows 1747
ACCF/AHA/AATS/ACR/ASA/SCA/OSHANA/BEXZOD/CAA/YOVILEN Guidelines for the
Diagnosis and Management of Patients with Thoracic Aortic Disease.
Circulation.1747; 121: [id number]
